# Patient Record
Sex: Female | Born: 1945 | Race: Black or African American | Hispanic: No | Marital: Married | Smoking: Never smoker
Health system: Southern US, Community
[De-identification: ages and names within clinical notes are randomized; demographics above are authoritative.]

## PROBLEM LIST (undated history)

## (undated) DIAGNOSIS — I10 Essential (primary) hypertension: Secondary | ICD-10-CM

## (undated) DIAGNOSIS — E039 Hypothyroidism, unspecified: Secondary | ICD-10-CM

## (undated) DIAGNOSIS — M1990 Unspecified osteoarthritis, unspecified site: Secondary | ICD-10-CM

## (undated) HISTORY — PX: COLONOSCOPY: SHX174

---

## 2017-05-27 DIAGNOSIS — E034 Atrophy of thyroid (acquired): Secondary | ICD-10-CM | POA: Diagnosis not present

## 2017-05-27 DIAGNOSIS — R5381 Other malaise: Secondary | ICD-10-CM | POA: Diagnosis not present

## 2017-05-27 DIAGNOSIS — E785 Hyperlipidemia, unspecified: Secondary | ICD-10-CM | POA: Diagnosis not present

## 2017-05-27 DIAGNOSIS — M545 Low back pain: Secondary | ICD-10-CM | POA: Diagnosis not present

## 2017-05-27 DIAGNOSIS — I10 Essential (primary) hypertension: Secondary | ICD-10-CM | POA: Diagnosis not present

## 2017-05-27 DIAGNOSIS — E7849 Other hyperlipidemia: Secondary | ICD-10-CM | POA: Diagnosis not present

## 2017-05-27 DIAGNOSIS — E669 Obesity, unspecified: Secondary | ICD-10-CM | POA: Diagnosis not present

## 2017-06-06 DIAGNOSIS — E785 Hyperlipidemia, unspecified: Secondary | ICD-10-CM | POA: Diagnosis not present

## 2017-06-06 DIAGNOSIS — I10 Essential (primary) hypertension: Secondary | ICD-10-CM | POA: Diagnosis not present

## 2017-06-06 DIAGNOSIS — E669 Obesity, unspecified: Secondary | ICD-10-CM | POA: Diagnosis not present

## 2017-07-05 DIAGNOSIS — M5440 Lumbago with sciatica, unspecified side: Secondary | ICD-10-CM | POA: Diagnosis not present

## 2017-07-05 DIAGNOSIS — E639 Nutritional deficiency, unspecified: Secondary | ICD-10-CM | POA: Diagnosis not present

## 2017-07-05 DIAGNOSIS — J111 Influenza due to unidentified influenza virus with other respiratory manifestations: Secondary | ICD-10-CM | POA: Diagnosis not present

## 2017-07-05 DIAGNOSIS — E889 Metabolic disorder, unspecified: Secondary | ICD-10-CM | POA: Diagnosis not present

## 2017-08-01 DIAGNOSIS — G8929 Other chronic pain: Secondary | ICD-10-CM | POA: Diagnosis not present

## 2017-08-01 DIAGNOSIS — Z791 Long term (current) use of non-steroidal anti-inflammatories (NSAID): Secondary | ICD-10-CM | POA: Diagnosis not present

## 2017-08-01 DIAGNOSIS — M1990 Unspecified osteoarthritis, unspecified site: Secondary | ICD-10-CM | POA: Diagnosis not present

## 2017-08-01 DIAGNOSIS — G629 Polyneuropathy, unspecified: Secondary | ICD-10-CM | POA: Diagnosis not present

## 2017-08-01 DIAGNOSIS — Z803 Family history of malignant neoplasm of breast: Secondary | ICD-10-CM | POA: Diagnosis not present

## 2017-08-01 DIAGNOSIS — E785 Hyperlipidemia, unspecified: Secondary | ICD-10-CM | POA: Diagnosis not present

## 2017-08-01 DIAGNOSIS — Z8249 Family history of ischemic heart disease and other diseases of the circulatory system: Secondary | ICD-10-CM | POA: Diagnosis not present

## 2017-08-01 DIAGNOSIS — Z833 Family history of diabetes mellitus: Secondary | ICD-10-CM | POA: Diagnosis not present

## 2017-08-01 DIAGNOSIS — I10 Essential (primary) hypertension: Secondary | ICD-10-CM | POA: Diagnosis not present

## 2017-08-01 DIAGNOSIS — Z809 Family history of malignant neoplasm, unspecified: Secondary | ICD-10-CM | POA: Diagnosis not present

## 2017-09-20 DIAGNOSIS — E639 Nutritional deficiency, unspecified: Secondary | ICD-10-CM | POA: Diagnosis not present

## 2017-09-20 DIAGNOSIS — M5440 Lumbago with sciatica, unspecified side: Secondary | ICD-10-CM | POA: Diagnosis not present

## 2017-09-20 DIAGNOSIS — E889 Metabolic disorder, unspecified: Secondary | ICD-10-CM | POA: Diagnosis not present

## 2017-10-20 ENCOUNTER — Other Ambulatory Visit: Payer: Self-pay | Admitting: Internal Medicine

## 2017-10-20 DIAGNOSIS — E889 Metabolic disorder, unspecified: Secondary | ICD-10-CM | POA: Diagnosis not present

## 2017-10-20 DIAGNOSIS — E639 Nutritional deficiency, unspecified: Secondary | ICD-10-CM | POA: Diagnosis not present

## 2017-10-20 DIAGNOSIS — M5440 Lumbago with sciatica, unspecified side: Secondary | ICD-10-CM | POA: Diagnosis not present

## 2017-10-20 DIAGNOSIS — E034 Atrophy of thyroid (acquired): Secondary | ICD-10-CM | POA: Diagnosis not present

## 2017-10-20 DIAGNOSIS — R5381 Other malaise: Secondary | ICD-10-CM | POA: Diagnosis not present

## 2017-10-20 DIAGNOSIS — R634 Abnormal weight loss: Secondary | ICD-10-CM

## 2017-10-21 ENCOUNTER — Other Ambulatory Visit: Payer: Self-pay | Admitting: Internal Medicine

## 2017-10-21 DIAGNOSIS — R634 Abnormal weight loss: Secondary | ICD-10-CM

## 2017-10-28 ENCOUNTER — Ambulatory Visit
Admission: RE | Admit: 2017-10-28 | Discharge: 2017-10-28 | Disposition: A | Discharge status: Home / Self Care | Payer: Medicare HMO | Source: Ambulatory Visit | Attending: Internal Medicine | Admitting: Internal Medicine

## 2017-10-28 DIAGNOSIS — R634 Abnormal weight loss: Secondary | ICD-10-CM

## 2017-11-01 ENCOUNTER — Ambulatory Visit
Admission: RE | Admit: 2017-11-01 | Discharge: 2017-11-01 | Disposition: A | Discharge status: Home / Self Care | Payer: Medicare HMO | Source: Ambulatory Visit | Attending: Internal Medicine | Admitting: Internal Medicine

## 2017-11-01 DIAGNOSIS — R634 Abnormal weight loss: Secondary | ICD-10-CM | POA: Diagnosis not present

## 2017-12-20 DIAGNOSIS — I10 Essential (primary) hypertension: Secondary | ICD-10-CM | POA: Diagnosis not present

## 2017-12-20 DIAGNOSIS — E034 Atrophy of thyroid (acquired): Secondary | ICD-10-CM | POA: Diagnosis not present

## 2017-12-20 DIAGNOSIS — E7849 Other hyperlipidemia: Secondary | ICD-10-CM | POA: Diagnosis not present

## 2017-12-20 DIAGNOSIS — M5440 Lumbago with sciatica, unspecified side: Secondary | ICD-10-CM | POA: Diagnosis not present

## 2017-12-20 DIAGNOSIS — R634 Abnormal weight loss: Secondary | ICD-10-CM | POA: Diagnosis not present

## 2017-12-20 DIAGNOSIS — R5381 Other malaise: Secondary | ICD-10-CM | POA: Diagnosis not present

## 2017-12-20 DIAGNOSIS — E639 Nutritional deficiency, unspecified: Secondary | ICD-10-CM | POA: Diagnosis not present

## 2017-12-20 DIAGNOSIS — E889 Metabolic disorder, unspecified: Secondary | ICD-10-CM | POA: Diagnosis not present

## 2017-12-23 DIAGNOSIS — Z0100 Encounter for examination of eyes and vision without abnormal findings: Secondary | ICD-10-CM | POA: Diagnosis not present

## 2017-12-23 DIAGNOSIS — I10 Essential (primary) hypertension: Secondary | ICD-10-CM | POA: Diagnosis not present

## 2017-12-23 DIAGNOSIS — H524 Presbyopia: Secondary | ICD-10-CM | POA: Diagnosis not present

## 2017-12-23 DIAGNOSIS — E7800 Pure hypercholesterolemia, unspecified: Secondary | ICD-10-CM | POA: Diagnosis not present

## 2017-12-23 DIAGNOSIS — H57059 Tonic pupil, unspecified eye: Secondary | ICD-10-CM | POA: Diagnosis not present

## 2017-12-23 DIAGNOSIS — H2510 Age-related nuclear cataract, unspecified eye: Secondary | ICD-10-CM | POA: Diagnosis not present

## 2018-02-27 DIAGNOSIS — E889 Metabolic disorder, unspecified: Secondary | ICD-10-CM | POA: Diagnosis not present

## 2018-02-27 DIAGNOSIS — M76891 Other specified enthesopathies of right lower limb, excluding foot: Secondary | ICD-10-CM | POA: Diagnosis not present

## 2018-02-27 DIAGNOSIS — E639 Nutritional deficiency, unspecified: Secondary | ICD-10-CM | POA: Diagnosis not present

## 2018-02-27 DIAGNOSIS — M5440 Lumbago with sciatica, unspecified side: Secondary | ICD-10-CM | POA: Diagnosis not present

## 2018-03-27 DIAGNOSIS — M5440 Lumbago with sciatica, unspecified side: Secondary | ICD-10-CM | POA: Diagnosis not present

## 2018-03-27 DIAGNOSIS — I10 Essential (primary) hypertension: Secondary | ICD-10-CM | POA: Diagnosis not present

## 2018-03-27 DIAGNOSIS — E639 Nutritional deficiency, unspecified: Secondary | ICD-10-CM | POA: Diagnosis not present

## 2018-03-27 DIAGNOSIS — E889 Metabolic disorder, unspecified: Secondary | ICD-10-CM | POA: Diagnosis not present

## 2018-06-07 DIAGNOSIS — M5440 Lumbago with sciatica, unspecified side: Secondary | ICD-10-CM | POA: Diagnosis not present

## 2018-06-07 DIAGNOSIS — E639 Nutritional deficiency, unspecified: Secondary | ICD-10-CM | POA: Diagnosis not present

## 2018-06-07 DIAGNOSIS — E889 Metabolic disorder, unspecified: Secondary | ICD-10-CM | POA: Diagnosis not present

## 2018-07-04 DIAGNOSIS — E639 Nutritional deficiency, unspecified: Secondary | ICD-10-CM | POA: Diagnosis not present

## 2018-07-04 DIAGNOSIS — I119 Hypertensive heart disease without heart failure: Secondary | ICD-10-CM | POA: Diagnosis not present

## 2018-07-04 DIAGNOSIS — M7050 Other bursitis of knee, unspecified knee: Secondary | ICD-10-CM | POA: Diagnosis not present

## 2018-07-04 DIAGNOSIS — E889 Metabolic disorder, unspecified: Secondary | ICD-10-CM | POA: Diagnosis not present

## 2018-10-09 DIAGNOSIS — M5440 Lumbago with sciatica, unspecified side: Secondary | ICD-10-CM | POA: Diagnosis not present

## 2018-10-09 DIAGNOSIS — Z0000 Encounter for general adult medical examination without abnormal findings: Secondary | ICD-10-CM | POA: Diagnosis not present

## 2018-10-09 DIAGNOSIS — G629 Polyneuropathy, unspecified: Secondary | ICD-10-CM | POA: Diagnosis not present

## 2018-10-09 DIAGNOSIS — I10 Essential (primary) hypertension: Secondary | ICD-10-CM | POA: Diagnosis not present

## 2018-10-10 DIAGNOSIS — E034 Atrophy of thyroid (acquired): Secondary | ICD-10-CM | POA: Diagnosis not present

## 2018-10-10 DIAGNOSIS — E7849 Other hyperlipidemia: Secondary | ICD-10-CM | POA: Diagnosis not present

## 2018-10-10 DIAGNOSIS — R5381 Other malaise: Secondary | ICD-10-CM | POA: Diagnosis not present

## 2018-10-10 DIAGNOSIS — R634 Abnormal weight loss: Secondary | ICD-10-CM | POA: Diagnosis not present

## 2018-10-10 DIAGNOSIS — I10 Essential (primary) hypertension: Secondary | ICD-10-CM | POA: Diagnosis not present

## 2018-10-17 DIAGNOSIS — R5381 Other malaise: Secondary | ICD-10-CM | POA: Diagnosis not present

## 2018-10-17 DIAGNOSIS — E034 Atrophy of thyroid (acquired): Secondary | ICD-10-CM | POA: Diagnosis not present

## 2018-10-17 DIAGNOSIS — I10 Essential (primary) hypertension: Secondary | ICD-10-CM | POA: Diagnosis not present

## 2018-10-17 DIAGNOSIS — E889 Metabolic disorder, unspecified: Secondary | ICD-10-CM | POA: Diagnosis not present

## 2018-10-17 DIAGNOSIS — M5440 Lumbago with sciatica, unspecified side: Secondary | ICD-10-CM | POA: Diagnosis not present

## 2018-10-17 DIAGNOSIS — R634 Abnormal weight loss: Secondary | ICD-10-CM | POA: Diagnosis not present

## 2018-10-17 DIAGNOSIS — E0590 Thyrotoxicosis, unspecified without thyrotoxic crisis or storm: Secondary | ICD-10-CM | POA: Diagnosis not present

## 2019-03-14 DIAGNOSIS — R69 Illness, unspecified: Secondary | ICD-10-CM | POA: Diagnosis not present

## 2019-03-19 DIAGNOSIS — R69 Illness, unspecified: Secondary | ICD-10-CM | POA: Diagnosis not present

## 2019-03-30 DIAGNOSIS — R5381 Other malaise: Secondary | ICD-10-CM | POA: Diagnosis not present

## 2019-03-30 DIAGNOSIS — E639 Nutritional deficiency, unspecified: Secondary | ICD-10-CM | POA: Diagnosis not present

## 2019-03-30 DIAGNOSIS — E0590 Thyrotoxicosis, unspecified without thyrotoxic crisis or storm: Secondary | ICD-10-CM | POA: Diagnosis not present

## 2019-03-30 DIAGNOSIS — R634 Abnormal weight loss: Secondary | ICD-10-CM | POA: Diagnosis not present

## 2019-03-30 DIAGNOSIS — M5440 Lumbago with sciatica, unspecified side: Secondary | ICD-10-CM | POA: Diagnosis not present

## 2019-03-30 DIAGNOSIS — E889 Metabolic disorder, unspecified: Secondary | ICD-10-CM | POA: Diagnosis not present

## 2019-04-05 ENCOUNTER — Other Ambulatory Visit: Payer: Self-pay | Admitting: Internal Medicine

## 2019-04-05 ENCOUNTER — Other Ambulatory Visit (HOSPITAL_COMMUNITY): Payer: Self-pay | Admitting: Internal Medicine

## 2019-04-05 DIAGNOSIS — R32 Unspecified urinary incontinence: Secondary | ICD-10-CM

## 2019-04-05 DIAGNOSIS — M545 Low back pain, unspecified: Secondary | ICD-10-CM

## 2019-04-05 DIAGNOSIS — N393 Stress incontinence (female) (male): Secondary | ICD-10-CM

## 2019-04-09 ENCOUNTER — Telehealth: Payer: Self-pay

## 2019-04-09 ENCOUNTER — Other Ambulatory Visit: Payer: Self-pay

## 2019-04-09 DIAGNOSIS — Z1211 Encounter for screening for malignant neoplasm of colon: Secondary | ICD-10-CM

## 2019-04-09 DIAGNOSIS — R159 Full incontinence of feces: Secondary | ICD-10-CM | POA: Diagnosis not present

## 2019-04-09 DIAGNOSIS — M5440 Lumbago with sciatica, unspecified side: Secondary | ICD-10-CM | POA: Diagnosis not present

## 2019-04-09 DIAGNOSIS — E889 Metabolic disorder, unspecified: Secondary | ICD-10-CM | POA: Diagnosis not present

## 2019-04-09 DIAGNOSIS — E639 Nutritional deficiency, unspecified: Secondary | ICD-10-CM | POA: Diagnosis not present

## 2019-04-09 MED ORDER — NA SULFATE-K SULFATE-MG SULF 17.5-3.13-1.6 GM/177ML PO SOLN: 354.0000 mL | Freq: Once | ORAL | 0 refills | Status: AC

## 2019-04-09 NOTE — Telephone Encounter (Signed)
Gastroenterology Pre-Procedure Review    PATIENT REVIEW QUESTIONS: The patient responded to the following health history questions as indicated:    1. Are you having any GI issues? no 2. Do you have a personal history of Polyps? no 3. Do you have a family history of Colon Cancer or Polyps? no 4. Diabetes Mellitus? no 5. Joint replacements in the past 12 months?no 6. Major health problems in the past 3 months?no 7. Any artificial heart valves, MVP, or defibrillator?no    MEDICATIONS & ALLERGIES:    Patient reports the following regarding taking any anticoagulation/antiplatelet therapy:   Plavix, Coumadin, Eliquis, Xarelto, Lovenox, Pradaxa, Brilinta, or Effient? no Aspirin? no  Patient confirms/reports the following medications:  No current outpatient medications on file.   No current facility-administered medications for this visit.    Patient confirms/reports the following allergies:  Not on File  No orders of the defined types were placed in this encounter.   AUTHORIZATION INFORMATION Primary Insurance: 1D#: Group #:  Secondary Insurance: 1D#: Group #:  SCHEDULE INFORMATION: Date: 04/17/2019 Time: Location:[facility]

## 2019-04-10 DIAGNOSIS — R69 Illness, unspecified: Secondary | ICD-10-CM | POA: Diagnosis not present

## 2019-04-11 ENCOUNTER — Other Ambulatory Visit: Payer: Medicare HMO

## 2019-04-11 ENCOUNTER — Ambulatory Visit
Admission: RE | Admit: 2019-04-11 | Discharge: 2019-04-11 | Disposition: A | Discharge status: Home / Self Care | Payer: Medicare HMO | Source: Ambulatory Visit | Attending: Internal Medicine | Admitting: Internal Medicine

## 2019-04-11 ENCOUNTER — Other Ambulatory Visit: Payer: Self-pay

## 2019-04-11 DIAGNOSIS — M545 Low back pain, unspecified: Secondary | ICD-10-CM

## 2019-04-11 DIAGNOSIS — R32 Unspecified urinary incontinence: Secondary | ICD-10-CM | POA: Diagnosis present

## 2019-04-11 DIAGNOSIS — N393 Stress incontinence (female) (male): Secondary | ICD-10-CM | POA: Diagnosis not present

## 2019-04-11 DIAGNOSIS — D251 Intramural leiomyoma of uterus: Secondary | ICD-10-CM | POA: Diagnosis not present

## 2019-04-12 ENCOUNTER — Ambulatory Visit: Payer: Medicare HMO

## 2019-04-13 ENCOUNTER — Other Ambulatory Visit: Admission: RE | Admit: 2019-04-13 | Payer: Medicare HMO | Source: Ambulatory Visit

## 2019-04-26 ENCOUNTER — Other Ambulatory Visit
Admission: RE | Admit: 2019-04-26 | Discharge: 2019-04-26 | Disposition: A | Discharge status: Home / Self Care | Payer: Medicare HMO | Source: Ambulatory Visit | Attending: Gastroenterology | Admitting: Gastroenterology

## 2019-04-26 ENCOUNTER — Other Ambulatory Visit: Payer: Self-pay

## 2019-04-26 DIAGNOSIS — Z20828 Contact with and (suspected) exposure to other viral communicable diseases: Secondary | ICD-10-CM | POA: Diagnosis not present

## 2019-04-26 DIAGNOSIS — Z01812 Encounter for preprocedural laboratory examination: Secondary | ICD-10-CM | POA: Insufficient documentation

## 2019-04-27 LAB — SARS CORONAVIRUS 2 (TAT 6-24 HRS): SARS Coronavirus 2: NEGATIVE

## 2019-04-30 ENCOUNTER — Encounter: Payer: Self-pay | Admitting: Gastroenterology

## 2019-05-01 ENCOUNTER — Telehealth: Payer: Self-pay

## 2019-05-01 ENCOUNTER — Ambulatory Visit: Payer: Medicare HMO | Admitting: Anesthesiology

## 2019-05-01 ENCOUNTER — Encounter: Payer: Self-pay | Admitting: Gastroenterology

## 2019-05-01 ENCOUNTER — Other Ambulatory Visit: Payer: Self-pay

## 2019-05-01 ENCOUNTER — Encounter
Admission: RE | Disposition: A | Discharge status: Home / Self Care | Payer: Self-pay | Source: Home / Self Care | Attending: Gastroenterology

## 2019-05-01 ENCOUNTER — Ambulatory Visit
Admission: RE | Admit: 2019-05-01 | Discharge: 2019-05-01 | Disposition: A | Discharge status: Home / Self Care | Payer: Medicare HMO | Attending: Gastroenterology | Admitting: Gastroenterology

## 2019-05-01 DIAGNOSIS — I10 Essential (primary) hypertension: Secondary | ICD-10-CM | POA: Diagnosis not present

## 2019-05-01 DIAGNOSIS — Z79899 Other long term (current) drug therapy: Secondary | ICD-10-CM | POA: Insufficient documentation

## 2019-05-01 DIAGNOSIS — K5730 Diverticulosis of large intestine without perforation or abscess without bleeding: Secondary | ICD-10-CM | POA: Insufficient documentation

## 2019-05-01 DIAGNOSIS — K5790 Diverticulosis of intestine, part unspecified, without perforation or abscess without bleeding: Secondary | ICD-10-CM | POA: Diagnosis not present

## 2019-05-01 DIAGNOSIS — Z1211 Encounter for screening for malignant neoplasm of colon: Secondary | ICD-10-CM

## 2019-05-01 DIAGNOSIS — K635 Polyp of colon: Secondary | ICD-10-CM

## 2019-05-01 DIAGNOSIS — M1990 Unspecified osteoarthritis, unspecified site: Secondary | ICD-10-CM | POA: Insufficient documentation

## 2019-05-01 DIAGNOSIS — E039 Hypothyroidism, unspecified: Secondary | ICD-10-CM | POA: Insufficient documentation

## 2019-05-01 DIAGNOSIS — D125 Benign neoplasm of sigmoid colon: Secondary | ICD-10-CM | POA: Diagnosis not present

## 2019-05-01 DIAGNOSIS — D126 Benign neoplasm of colon, unspecified: Secondary | ICD-10-CM | POA: Diagnosis not present

## 2019-05-01 HISTORY — PX: COLONOSCOPY WITH PROPOFOL: SHX5780

## 2019-05-01 HISTORY — DX: Unspecified osteoarthritis, unspecified site: M19.90

## 2019-05-01 HISTORY — DX: Hypothyroidism, unspecified: E03.9

## 2019-05-01 HISTORY — DX: Essential (primary) hypertension: I10

## 2019-05-01 SURGERY — COLONOSCOPY WITH PROPOFOL
Anesthesia: General

## 2019-05-01 MED ORDER — NA SULFATE-K SULFATE-MG SULF 17.5-3.13-1.6 GM/177ML PO SOLN: 354.0000 mL | Freq: Once | ORAL | 0 refills | Status: AC

## 2019-05-01 MED ORDER — ESMOLOL HCL 100 MG/10ML IV SOLN
INTRAVENOUS | Status: DC | PRN
  Administered 2019-05-01 (×2): 10 mg via INTRAVENOUS

## 2019-05-01 MED ORDER — LIDOCAINE HCL (CARDIAC) PF 100 MG/5ML IV SOSY
PREFILLED_SYRINGE | INTRAVENOUS | Status: DC | PRN
  Administered 2019-05-01: 100 mg via INTRAVENOUS

## 2019-05-01 MED ORDER — PROPOFOL 10 MG/ML IV BOLUS
INTRAVENOUS | Status: DC | PRN
  Administered 2019-05-01: 40 mg via INTRAVENOUS
  Administered 2019-05-01 (×3): 30 mg via INTRAVENOUS
  Administered 2019-05-01: 70 mg via INTRAVENOUS

## 2019-05-01 MED ORDER — SODIUM CHLORIDE 0.9 % IV SOLN: INTRAVENOUS | Status: DC

## 2019-05-01 MED ORDER — BISACODYL EC 5 MG PO TBEC: DELAYED_RELEASE_TABLET | ORAL | 0 refills | Status: AC

## 2019-05-01 MED ORDER — PROPOFOL 500 MG/50ML IV EMUL
INTRAVENOUS | Status: DC | PRN
  Administered 2019-05-01: 100 ug/kg/min via INTRAVENOUS

## 2019-05-01 NOTE — Transfer of Care (Signed)
Immediate Anesthesia Transfer of Care Note  Patient: Joyce Owens  Procedure(s) Performed: COLONOSCOPY WITH PROPOFOL (N/A )  Patient Location: PACU and Endoscopy Unit  Anesthesia Type:General  Level of Consciousness: awake, drowsy and patient cooperative  Airway & Oxygen Therapy: Patient Spontanous Breathing  Post-op Assessment: Report given to RN, Post -op Vital signs reviewed and stable and Patient moving all extremities  Post vital signs: Reviewed and stable  Last Vitals:  Vitals Value Taken Time  BP 158/100 05/01/19 1133  Temp 36.6 C 05/01/19 1133  Pulse 81 05/01/19 1134  Resp 20 05/01/19 1134  SpO2 100 % 05/01/19 1134  Vitals shown include unvalidated device data.  Last Pain:  Vitals:   05/01/19 1133  TempSrc: Temporal  PainSc: 0-No pain         Complications: No apparent anesthesia complications

## 2019-05-01 NOTE — H&P (Signed)
Vonda Antigua, MD [address], [place], [place], [place], [address] [address], [place], [place], [place], [address] Phone: [phone]  Fax: [phone]  Primary Care Physician:  Cletis Athens, MD   Pre-Procedure History & Physical: HPI:  Joyce Owens is a 74 y.o. female is here for a colonoscopy.   Past Medical History:  Diagnosis Date  . Arthritis   . Hypertension   . Hypothyroidism     Past Surgical History:  Procedure Laterality Date  . CESAREAN SECTION    . COLONOSCOPY      Prior to Admission medications   Medication Sig Start Date End Date Taking? Authorizing Provider  atorvastatin (LIPITOR) 10 MG tablet Take 10 mg by mouth daily.   Yes [provider]  enalapril (VASOTEC) 20 MG tablet Take 40 mg by mouth daily.   Yes [provider]    Allergies as of 04/09/2019  . (Not on File)    History reviewed. No pertinent family history.  Social History   Socioeconomic History  . Marital status: Married    Spouse name: Not on file  . Number of children: Not on file  . Years of education: Not on file  . Highest education level: Not on file  Occupational History  . Not on file  Tobacco Use  . Smoking status: Never Smoker  . Smokeless tobacco: Never Used  Substance and Sexual Activity  . Alcohol use: Never  . Drug use: Never  . Sexual activity: Not on file  Other Topics Concern  . Not on file  Social History Narrative  . Not on file   Social Determinants of Health   Financial Resource Strain:   . Difficulty of Paying Living Expenses: Not on file  Food Insecurity:   . Worried About Charity fundraiser in the Last Year: Not on file  . Ran Out of Food in the Last Year: Not on file  Transportation Needs:   . Lack of Transportation (Medical): Not on file  . Lack of Transportation (Non-Medical): Not on file  Physical Activity:   . Days of Exercise per Week: Not on file  . Minutes of Exercise per Session: Not on file    Stress:   . Feeling of Stress : Not on file  Social Connections:   . Frequency of Communication with Friends and Family: Not on file  . Frequency of Social Gatherings with Friends and Family: Not on file  . Attends Religious Services: Not on file  . Active Member of Clubs or Organizations: Not on file  . Attends Archivist Meetings: Not on file  . Marital Status: Not on file  Intimate Partner Violence:   . Fear of Current or Ex-Partner: Not on file  . Emotionally Abused: Not on file  . Physically Abused: Not on file  . Sexually Abused: Not on file    Review of Systems: See HPI, otherwise negative ROS  Physical Exam: BP (!) 163/98   Pulse (!) 103   Temp 97.7 F (36.5 C) (Temporal)   Resp 20   Ht 5\' 2"  (1.575 m)   Wt 63.5 kg   SpO2 100%   BMI 25.61 kg/m  General:   Alert,  pleasant and cooperative in NAD Head:  Normocephalic and atraumatic. Neck:  Supple; no masses or thyromegaly. Lungs:  Clear throughout to auscultation, normal respiratory effort.    Heart:  +S1, +S2, Regular rate and rhythm, No edema. Abdomen:  Soft, nontender and nondistended. Normal bowel sounds,  without guarding, and without rebound.   Neurologic:  Alert and  oriented x4;  grossly normal neurologically.  Impression/Plan: Joyce Owens is here for a colonoscopy to be performed for average risk screening.  Risks, benefits, limitations, and alternatives regarding  colonoscopy have been reviewed with the patient.  Questions have been answered.  All parties agreeable.   Virgel Manifold, MD  05/01/2019, 11:00 AM

## 2019-05-01 NOTE — Anesthesia Postprocedure Evaluation (Signed)
Anesthesia Post Note  Patient: Joyce Owens  Procedure(s) Performed: COLONOSCOPY WITH PROPOFOL (N/A )  Patient location during evaluation: Endoscopy Anesthesia Type: General Level of consciousness: awake and alert Pain management: pain level controlled Vital Signs Assessment: post-procedure vital signs reviewed and stable Respiratory status: spontaneous breathing, nonlabored ventilation, respiratory function stable and patient connected to nasal cannula oxygen Cardiovascular status: blood pressure returned to baseline and stable Postop Assessment: no apparent nausea or vomiting Anesthetic complications: no     Last Vitals:  Vitals:   05/01/19 1143 05/01/19 1153  BP: (!) 163/88 (!) 171/81  Pulse:    Resp:    Temp:    SpO2:      Last Pain:  Vitals:   05/01/19 1153  TempSrc:   PainSc: 0-No pain                 Martha Clan

## 2019-05-01 NOTE — Telephone Encounter (Signed)
Per Dr. Bonna Gains had poor prep. reschedule with any provider for repeat with 2 day prep and low fiber diet for 1 week prior to the procedure

## 2019-05-01 NOTE — Op Note (Signed)
[facility] Gastroenterology Patient Name: Joyce Owens Procedure Date: 05/01/2019 10:56 AM MRN: [id number] Account #: [id number] Date of Birth: [birth year] Admit Type: Outpatient Age: 74 Room: [facility] ENDO ROOM 4 Gender: Female Note Status: Finalized Procedure:             Colonoscopy Indications:           Screening for colorectal malignant neoplasm Providers:             Sabryn Preslar B. Bonna Gains MD, MD Referring MD:          Cletis Athens, MD (Referring MD) Medicines:             Monitored Anesthesia Care Complications:         No immediate complications. Procedure:             Pre-Anesthesia Assessment:                        - ASA Grade Assessment: II - A patient with mild                         systemic disease.                        - Prior to the procedure, a History and Physical was                         performed, and patient medications, allergies and                         sensitivities were reviewed. The patient's tolerance                         of previous anesthesia was reviewed.                        - The risks and benefits of the procedure and the                         sedation options and risks were discussed with the                         patient. All questions were answered and informed                         consent was obtained.                        - Patient identification and proposed procedure were                         verified prior to the procedure by the physician, the                         nurse, the anesthesiologist, the anesthetist and the                         technician. The procedure was verified in the                         procedure room.  After obtaining informed consent, the colonoscope was                         passed under direct vision. Throughout the procedure,                         the patient's blood pressure, pulse, and oxygen                         saturations were  monitored continuously. The                         Colonoscope was introduced through the anus and                         advanced to the the cecum, identified by appendiceal                         orifice and ileocecal valve. The colonoscopy was                         performed with ease. The patient tolerated the                         procedure well. The quality of the bowel preparation                         was poor. Findings:      The perianal and digital rectal examinations were normal.      A 8 mm polyp was found in the sigmoid colon. The polyp was       semi-pedunculated. The polyp was removed with a hot snare. Resection and       retrieval were complete.      A single diverticulum was found in the ascending colon.      The exam was otherwise without abnormality.      The retroflexed view of the distal rectum and anal verge was normal and       showed no anal or rectal abnormalities. Impression:            - Preparation of the colon was poor.                        - One 8 mm polyp in the sigmoid colon, removed with a                         hot snare. Resected and retrieved.                        - Diverticulosis in the ascending colon.                        - The examination was otherwise normal.                        - The distal rectum and anal verge are normal on                         retroflexion view. Recommendation:        -  Discharge patient to home (with escort).                        - Advance diet as tolerated.                        - Continue present medications.                        - Await pathology results.                        - Repeat colonoscopy within 3 months, with 2 day prep.                         Low fiber diet 1 week prior.                        - The findings and recommendations were discussed with                         the patient.                        - The findings and recommendations were discussed with                          the patient's family.                        - Return to primary care physician as previously                         scheduled.                        - High fiber diet. Procedure Code(s):     --- Professional ---                        [phone], Colonoscopy, flexible; with removal of                         tumor(s), polyp(s), or other lesion(s) by snare                         technique Diagnosis Code(s):     --- Professional ---                        Z12.11, Encounter for screening for malignant neoplasm                         of colon                        K63.5, Polyp of colon CPT copyright 2019 American Medical Association. All rights reserved. The codes documented in this report are preliminary and upon coder review may  be revised to meet current compliance requirements.  Vonda Antigua, MD Margretta Sidle B. Bonna Gains MD, MD 05/01/2019 11:34:30 AM This report has been signed electronically. Number of Addenda: 0 Note Initiated On: 05/01/2019 10:56 AM Scope Withdrawal Time: 0 hours 9  minutes 25 seconds  Total Procedure Duration: 0 hours 16 minutes 44 seconds  Estimated Blood Loss:  Estimated blood loss: none.      [facility]

## 2019-05-01 NOTE — Anesthesia Preprocedure Evaluation (Addendum)
Anesthesia Evaluation  Patient identified by MRN, date of birth, ID band Patient awake    Reviewed: Allergy & Precautions, NPO status , Patient's Chart, lab work & pertinent test results  History of Anesthesia Complications Negative for: history of anesthetic complications  Airway Mallampati: II       Dental  (+) Missing, Chipped   Pulmonary neg sleep apnea, neg COPD, Not current smoker,           Cardiovascular hypertension, Pt. on medications (-) Past MI and (-) CHF (-) dysrhythmias (-) Valvular Problems/Murmurs     Neuro/Psych neg Seizures    GI/Hepatic Neg liver ROS, neg GERD  ,  Endo/Other  neg diabetesHypothyroidism   Renal/GU negative Renal ROS     Musculoskeletal   Abdominal   Peds  Hematology   Anesthesia Other Findings   Reproductive/Obstetrics                             Anesthesia Physical Anesthesia Plan  ASA: II  Anesthesia Plan: General   Post-op Pain Management:    Induction: Intravenous  PONV Risk Score and Plan: 3 and Propofol infusion, TIVA and Treatment may vary due to age or medical condition  Airway Management Planned: Nasal Cannula  Additional Equipment:   Intra-op Plan:   Post-operative Plan:   Informed Consent: I have reviewed the patients History and Physical, chart, labs and discussed the procedure including the risks, benefits and alternatives for the proposed anesthesia with the patient or authorized representative who has indicated his/her understanding and acceptance.       Plan Discussed with:   Anesthesia Plan Comments:         Anesthesia Quick Evaluation

## 2019-05-01 NOTE — Telephone Encounter (Signed)
Called and left a message with granddaughter for call back

## 2019-05-01 NOTE — Addendum Note (Signed)
Addended by: Ulyess Blossom L on: 05/01/2019 01:09 PM   Modules accepted: Orders

## 2019-05-01 NOTE — Telephone Encounter (Signed)
Patient called back and scheduled procedure for 05/29/2019. Informed patient of instructions about 1 week low fiber diet. Patient was instructed for 2 day prep also. Sent prep to the pharmacy and mailed instructions to patient

## 2019-05-02 ENCOUNTER — Telehealth: Payer: Self-pay

## 2019-05-02 ENCOUNTER — Encounter: Payer: Self-pay | Admitting: *Deleted

## 2019-05-02 LAB — SURGICAL PATHOLOGY

## 2019-05-02 NOTE — Telephone Encounter (Signed)
Patient was informed of this information and is scheduled for repeat colonoscopy on 05/29/2019

## 2019-05-02 NOTE — Telephone Encounter (Signed)
-----   Message from Virgel Manifold, MD sent at 05/02/2019 10:39 AM EST ----- Caryl Pina please let the patient know, the polyp removed from her colon was precancerous and was completely removed.  However, due to her poor prep, I recommend a colonoscopy within 3 months with a 2-day prep.  Also she should be on a low fiber diet for 1 week prior to the procedure.  Please schedule with any provider available

## 2019-05-07 ENCOUNTER — Telehealth: Payer: Self-pay

## 2019-05-07 DIAGNOSIS — E785 Hyperlipidemia, unspecified: Secondary | ICD-10-CM | POA: Diagnosis not present

## 2019-05-07 DIAGNOSIS — I10 Essential (primary) hypertension: Secondary | ICD-10-CM | POA: Diagnosis not present

## 2019-05-07 DIAGNOSIS — E0500 Thyrotoxicosis with diffuse goiter without thyrotoxic crisis or storm: Secondary | ICD-10-CM | POA: Diagnosis not present

## 2019-05-07 DIAGNOSIS — R634 Abnormal weight loss: Secondary | ICD-10-CM | POA: Diagnosis not present

## 2019-05-07 DIAGNOSIS — R002 Palpitations: Secondary | ICD-10-CM | POA: Diagnosis not present

## 2019-05-07 DIAGNOSIS — E0590 Thyrotoxicosis, unspecified without thyrotoxic crisis or storm: Secondary | ICD-10-CM | POA: Diagnosis not present

## 2019-05-07 NOTE — Telephone Encounter (Signed)
Called and left a message for call back to rescheduled patient colonoscopy that is scheduled on 05/29/2019

## 2019-05-08 ENCOUNTER — Other Ambulatory Visit: Payer: Self-pay

## 2019-05-08 DIAGNOSIS — Z1211 Encounter for screening for malignant neoplasm of colon: Secondary | ICD-10-CM

## 2019-05-08 NOTE — Telephone Encounter (Signed)
Called patient and put patient on for 05/22/2019 in [place] with Dr. Vicente Males. Patient will go for COVID test on 05/18/2019. Went over instructions with patient and patient verbalized understanding and verbalized the 2 day prep

## 2019-05-08 NOTE — Telephone Encounter (Signed)
Patient states she would like to rescheduled it when we know the [place] scheduled. Put patient on the Wait list for [place]. Patient is supposed to be a 2 day prep.

## 2019-05-10 ENCOUNTER — Other Ambulatory Visit: Payer: Self-pay

## 2019-05-10 ENCOUNTER — Encounter: Payer: Self-pay | Admitting: Gastroenterology

## 2019-05-11 DIAGNOSIS — I10 Essential (primary) hypertension: Secondary | ICD-10-CM | POA: Diagnosis not present

## 2019-05-11 DIAGNOSIS — E663 Overweight: Secondary | ICD-10-CM | POA: Diagnosis not present

## 2019-05-11 DIAGNOSIS — R69 Illness, unspecified: Secondary | ICD-10-CM | POA: Diagnosis not present

## 2019-05-15 DIAGNOSIS — R69 Illness, unspecified: Secondary | ICD-10-CM | POA: Diagnosis not present

## 2019-05-16 DIAGNOSIS — E0590 Thyrotoxicosis, unspecified without thyrotoxic crisis or storm: Secondary | ICD-10-CM | POA: Diagnosis not present

## 2019-05-16 DIAGNOSIS — R634 Abnormal weight loss: Secondary | ICD-10-CM | POA: Diagnosis not present

## 2019-05-16 DIAGNOSIS — I10 Essential (primary) hypertension: Secondary | ICD-10-CM | POA: Diagnosis not present

## 2019-05-16 DIAGNOSIS — R002 Palpitations: Secondary | ICD-10-CM | POA: Diagnosis not present

## 2019-05-16 DIAGNOSIS — E785 Hyperlipidemia, unspecified: Secondary | ICD-10-CM | POA: Diagnosis not present

## 2019-05-16 DIAGNOSIS — E0500 Thyrotoxicosis with diffuse goiter without thyrotoxic crisis or storm: Secondary | ICD-10-CM | POA: Diagnosis not present

## 2019-05-17 DIAGNOSIS — R69 Illness, unspecified: Secondary | ICD-10-CM | POA: Diagnosis not present

## 2019-05-18 ENCOUNTER — Other Ambulatory Visit: Payer: Self-pay

## 2019-05-18 ENCOUNTER — Telehealth: Payer: Self-pay

## 2019-05-18 ENCOUNTER — Other Ambulatory Visit
Admission: RE | Admit: 2019-05-18 | Discharge: 2019-05-18 | Disposition: A | Discharge status: Home / Self Care | Payer: Medicare HMO | Source: Ambulatory Visit | Attending: Gastroenterology | Admitting: Gastroenterology

## 2019-05-18 DIAGNOSIS — Z01812 Encounter for preprocedural laboratory examination: Secondary | ICD-10-CM | POA: Insufficient documentation

## 2019-05-18 DIAGNOSIS — Z20822 Contact with and (suspected) exposure to covid-19: Secondary | ICD-10-CM | POA: Insufficient documentation

## 2019-05-18 LAB — SARS CORONAVIRUS 2 (TAT 6-24 HRS): SARS Coronavirus 2: NEGATIVE

## 2019-05-18 MED ORDER — GOLYTELY 236 G PO SOLR: 4000.0000 mL | Freq: Once | ORAL | 0 refills | Status: AC

## 2019-05-18 NOTE — Telephone Encounter (Signed)
Patient states the suprep is to expensive. Called in go[place] to patient pharmacy

## 2019-05-21 NOTE — Discharge Instructions (Signed)
General Anesthesia, Adult, Care After This sheet gives you information about how to care for yourself after your procedure. Your health care provider may also give you more specific instructions. If you have problems or questions, contact your health care provider. What can I expect after the procedure? After the procedure, the following side effects are common:  Pain or discomfort at the IV site.  Nausea.  Vomiting.  Sore throat.  Trouble concentrating.  Feeling cold or chills.  Weak or tired.  Sleepiness and fatigue.  Soreness and body aches. These side effects can affect parts of the body that were not involved in surgery. Follow these instructions at home:  For at least 24 hours after the procedure:  Have a responsible adult stay with you. It is important to have someone help care for you until you are awake and alert.  Rest as needed.  Do not: ? Participate in activities in which you could fall or become injured. ? Drive. ? Use heavy machinery. ? Drink alcohol. ? Take sleeping pills or medicines that cause drowsiness. ? Make important decisions or sign legal documents. ? Take care of children on your own. Eating and drinking  Follow any instructions from your health care provider about eating or drinking restrictions.  When you feel hungry, start by eating small amounts of foods that are soft and easy to digest (bland), such as toast. Gradually return to your regular diet.  Drink enough fluid to keep your urine pale yellow.  If you vomit, rehydrate by drinking water, juice, or clear broth. General instructions  If you have sleep apnea, surgery and certain medicines can increase your risk for breathing problems. Follow instructions from your health care provider about wearing your sleep device: ? Anytime you are sleeping, including during daytime naps. ? While taking prescription pain medicines, sleeping medicines, or medicines that make you drowsy.  Return to  your normal activities as told by your health care provider. Ask your health care provider what activities are safe for you.  Take over-the-counter and prescription medicines only as told by your health care provider.  If you smoke, do not smoke without supervision.  Keep all follow-up visits as told by your health care provider. This is important. Contact a health care provider if:  You have nausea or vomiting that does not get better with medicine.  You cannot eat or drink without vomiting.  You have pain that does not get better with medicine.  You are unable to pass urine.  You develop a skin rash.  You have a fever.  You have redness around your IV site that gets worse. Get help right away if:  You have difficulty breathing.  You have chest pain.  You have blood in your urine or stool, or you vomit blood. Summary  After the procedure, it is common to have a sore throat or nausea. It is also common to feel tired.  Have a responsible adult stay with you for the first 24 hours after general anesthesia. It is important to have someone help care for you until you are awake and alert.  When you feel hungry, start by eating small amounts of foods that are soft and easy to digest (bland), such as toast. Gradually return to your regular diet.  Drink enough fluid to keep your urine pale yellow.  Return to your normal activities as told by your health care provider. Ask your health care provider what activities are safe for you. This information is not   intended to replace advice given to you by your health care provider. Make sure you discuss any questions you have with your health care provider. Document Revised: 04/15/2017 Document Reviewed: 11/26/2016 Elsevier Patient Education  2020 [employer] Inc[employer]  

## 2019-05-21 NOTE — Anesthesia Preprocedure Evaluation (Addendum)
Anesthesia Evaluation  Patient identified by MRN, date of birth, ID band Patient awake    Reviewed: Allergy & Precautions, NPO status , Patient's Chart, lab work & pertinent test results  History of Anesthesia Complications Negative for: history of anesthetic complications  Airway Mallampati: II  TM Distance: >3 FB Neck ROM: Limited    Dental  (+) Upper Dentures, Lower Dentures   Pulmonary    breath sounds clear to auscultation       Cardiovascular hypertension, Pt. on medications (-) angina(-) DOE  Rhythm:Regular Rate:Normal     Neuro/Psych    GI/Hepatic neg GERD  ,  Endo/Other  Hypothyroidism   Renal/GU      Musculoskeletal  (+) Arthritis ,   Abdominal   Peds  Hematology   Anesthesia Other Findings   Reproductive/Obstetrics                            Anesthesia Physical Anesthesia Plan  ASA: II  Anesthesia Plan: General   Post-op Pain Management:    Induction: Intravenous  PONV Risk Score and Plan: 3 and Propofol infusion, TIVA and Treatment may vary due to age or medical condition  Airway Management Planned: Natural Airway and Nasal Cannula  Additional Equipment:   Intra-op Plan:   Post-operative Plan:   Informed Consent: I have reviewed the patients History and Physical, chart, labs and discussed the procedure including the risks, benefits and alternatives for the proposed anesthesia with the patient or authorized representative who has indicated his/her understanding and acceptance.       Plan Discussed with: CRNA and Anesthesiologist  Anesthesia Plan Comments:         Anesthesia Quick Evaluation

## 2019-05-22 ENCOUNTER — Other Ambulatory Visit: Payer: Self-pay

## 2019-05-22 ENCOUNTER — Ambulatory Visit
Admission: RE | Admit: 2019-05-22 | Discharge: 2019-05-22 | Disposition: A | Discharge status: Home / Self Care | Payer: Medicare HMO | Attending: Gastroenterology | Admitting: Gastroenterology

## 2019-05-22 ENCOUNTER — Encounter
Admission: RE | Disposition: A | Discharge status: Home / Self Care | Payer: Self-pay | Source: Home / Self Care | Attending: Gastroenterology

## 2019-05-22 ENCOUNTER — Ambulatory Visit: Payer: Medicare HMO | Admitting: Anesthesiology

## 2019-05-22 DIAGNOSIS — Z1211 Encounter for screening for malignant neoplasm of colon: Secondary | ICD-10-CM | POA: Diagnosis not present

## 2019-05-22 DIAGNOSIS — I10 Essential (primary) hypertension: Secondary | ICD-10-CM | POA: Insufficient documentation

## 2019-05-22 DIAGNOSIS — Z79899 Other long term (current) drug therapy: Secondary | ICD-10-CM | POA: Diagnosis not present

## 2019-05-22 DIAGNOSIS — M1990 Unspecified osteoarthritis, unspecified site: Secondary | ICD-10-CM | POA: Diagnosis not present

## 2019-05-22 HISTORY — PX: COLONOSCOPY WITH PROPOFOL: SHX5780

## 2019-05-22 SURGERY — COLONOSCOPY WITH PROPOFOL
Anesthesia: General

## 2019-05-22 MED ORDER — LACTATED RINGERS IV SOLN
100.0000 mL/h | INTRAVENOUS | Status: DC
  Administered 2019-05-22: 100 mL/h via INTRAVENOUS

## 2019-05-22 MED ORDER — ONDANSETRON HCL 4 MG/2ML IJ SOLN: 4.0000 mg | Freq: Once | INTRAMUSCULAR | Status: DC | PRN

## 2019-05-22 MED ORDER — ACETAMINOPHEN 10 MG/ML IV SOLN: 1000.0000 mg | Freq: Once | INTRAVENOUS | Status: DC | PRN

## 2019-05-22 MED ORDER — LIDOCAINE HCL (CARDIAC) PF 100 MG/5ML IV SOSY
PREFILLED_SYRINGE | INTRAVENOUS | Status: DC | PRN
  Administered 2019-05-22: 40 mg via INTRAVENOUS

## 2019-05-22 MED ORDER — PROPOFOL 10 MG/ML IV BOLUS
INTRAVENOUS | Status: DC | PRN
  Administered 2019-05-22: 30 mg via INTRAVENOUS
  Administered 2019-05-22: 80 mg via INTRAVENOUS
  Administered 2019-05-22 (×2): 20 mg via INTRAVENOUS

## 2019-05-22 SURGICAL SUPPLY — 5 items
CANISTER SUCT 1200ML W/VALVE (MISCELLANEOUS) ×2 IMPLANT
GOWN CVR UNV OPN BCK APRN NK (MISCELLANEOUS) ×2 IMPLANT
GOWN ISOL THUMB LOOP REG UNIV (MISCELLANEOUS) ×2
KIT ENDO PROCEDURE OLY (KITS) ×2 IMPLANT
WATER STERILE IRR 250ML POUR (IV SOLUTION) ×2 IMPLANT

## 2019-05-22 NOTE — Anesthesia Postprocedure Evaluation (Signed)
Anesthesia Post Note  Patient: Joyce Owens  Procedure(s) Performed: COLONOSCOPY WITH PROPOFOL (N/A )     Patient location during evaluation: PACU Anesthesia Type: General Level of consciousness: awake and alert Pain management: pain level controlled Vital Signs Assessment: post-procedure vital signs reviewed and stable Respiratory status: spontaneous breathing, nonlabored ventilation, respiratory function stable and patient connected to nasal cannula oxygen Cardiovascular status: blood pressure returned to baseline and stable Postop Assessment: no apparent nausea or vomiting Anesthetic complications: no    Lailani Tool A  Tiyona Desouza

## 2019-05-22 NOTE — Anesthesia Procedure Notes (Signed)
Performed by: Maribel Luis, CRNA Pre-anesthesia Checklist: Patient identified, Emergency Drugs available, Suction available, Timeout performed and Patient being monitored Patient Re-evaluated:Patient Re-evaluated prior to induction Oxygen Delivery Method: Nasal cannula Placement Confirmation: positive ETCO2       

## 2019-05-22 NOTE — Op Note (Signed)
[facility] Gastroenterology Patient Name: Joyce Owens Procedure Date: 05/22/2019 8:01 AM MRN: [id number] Account #: [id number] Date of Birth: Feb 28, 1946 Admit Type: Outpatient Age: 74 Room: [facility] OR ROOM 01 Gender: Female Note Status: Finalized Procedure:             Colonoscopy Indications:           Screening for colorectal malignant neoplasm Providers:             Jonathon Bellows MD, MD Referring MD:          [facility] ***Barton Dubois (Referring MD), Cletis Athens, MD (Referring MD) Medicines:             Monitored Anesthesia Care Complications:         No immediate complications. Procedure:             Pre-Anesthesia Assessment:                        - Prior to the procedure, a History and Physical was                         performed, and patient medications, allergies and                         sensitivities were reviewed. The patient's tolerance                         of previous anesthesia was reviewed.                        - The risks and benefits of the procedure and the                         sedation options and risks were discussed with the                         patient. All questions were answered and informed                         consent was obtained.                        - ASA Grade Assessment: II - A patient with mild                         systemic disease.                        After obtaining informed consent, the colonoscope was                         passed under direct vision. Throughout the procedure,                         the patient's blood pressure, pulse, and oxygen                         saturations were monitored continuously. The  Colonoscope was introduced through the anus and                         advanced to the the cecum, identified by the                         appendiceal orifice. The colonoscopy was performed                         with ease. The  patient tolerated the procedure well.                         The quality of the bowel preparation was poor. Findings:      The perianal and digital rectal examinations were normal.      A large amount of stool was found in the descending colon, in the       transverse colon and in the ascending colon, interfering with       visualization. Impression:            - Preparation of the colon was poor.                        - Stool in the descending colon, in the transverse                         colon and in the ascending colon.                        - No specimens collected. Recommendation:        - Discharge patient to home (with escort).                        - Resume previous diet.                        - Continue present medications.                        - Repeat colonoscopy in 6 weeks because the bowel                         preparation was suboptimal. Procedure Code(s):     --- Professional ---                        [phone], Colonoscopy, flexible; diagnostic, including                         collection of specimen(s) by brushing or washing, when                         performed (separate procedure) Diagnosis Code(s):     --- Professional ---                        Z12.11, Encounter for screening for malignant neoplasm                         of colon CPT copyright 2019 American Medical Association. All rights reserved. The codes documented in this report  are preliminary and upon coder review may  be revised to meet current compliance requirements. Jonathon Bellows, MD Jonathon Bellows MD, MD 05/22/2019 8:24:43 AM This report has been signed electronically. Number of Addenda: 0 Note Initiated On: 05/22/2019 8:01 AM Scope Withdrawal Time: 0 hours 1 minute 56 seconds  Total Procedure Duration: 0 hours 6 minutes 12 seconds  Estimated Blood Loss:  Estimated blood loss: none.      [facility]

## 2019-05-22 NOTE — H&P (Signed)
Joyce Bellows, MD [address] [place], [place], [place], [address] [address], [place], [place], [place], [address] Phone: [phone]  Fax: [phone]  Primary Care Physician:  Cletis Athens, MD   Pre-Procedure History & Physical: HPI:  Joyce Owens is a 74 y.o. female is here for an colonoscopy.   Past Medical History:  Diagnosis Date  . Arthritis   . Hypertension   . Hypothyroidism     Past Surgical History:  Procedure Laterality Date  . CESAREAN SECTION    . COLONOSCOPY    . COLONOSCOPY WITH PROPOFOL N/A 05/01/2019   Procedure: COLONOSCOPY WITH PROPOFOL;  Surgeon: Virgel Manifold, MD;  Location: [facility] ENDOSCOPY;  Service: Endoscopy;  Laterality: N/A;    Prior to Admission medications   Medication Sig Start Date End Date Taking? Authorizing Provider  atorvastatin (LIPITOR) 10 MG tablet Take 10 mg by mouth daily.   Yes [provider]  bisacodyl (BISACODYL) 5 MG EC tablet Take 2 tablets (10mg ) by mouth the day before your procedure between 1pm-3pm. 05/01/19  Yes Joyce Bellows, MD  enalapril (VASOTEC) 20 MG tablet Take 40 mg by mouth daily.   Yes [provider]    Allergies as of 05/09/2019  . (Not on File)    History reviewed. No pertinent family history.  Social History   Socioeconomic History  . Marital status: Married    Spouse name: Not on file  . Number of children: Not on file  . Years of education: Not on file  . Highest education level: Not on file  Occupational History  . Not on file  Tobacco Use  . Smoking status: Never Smoker  . Smokeless tobacco: Never Used  Substance and Sexual Activity  . Alcohol use: Never  . Drug use: Never  . Sexual activity: Not on file  Other Topics Concern  . Not on file  Social History Narrative  . Not on file   Social Determinants of Health   Financial Resource Strain:   . Difficulty of Paying Living Expenses: Not on file  Food Insecurity:   . Worried About Sales executive in the Last Year: Not on file  . Ran Out of Food in the Last Year: Not on file  Transportation Needs:   . Lack of Transportation (Medical): Not on file  . Lack of Transportation (Non-Medical): Not on file  Physical Activity:   . Days of Exercise per Week: Not on file  . Minutes of Exercise per Session: Not on file  Stress:   . Feeling of Stress : Not on file  Social Connections:   . Frequency of Communication with Friends and Family: Not on file  . Frequency of Social Gatherings with Friends and Family: Not on file  . Attends Religious Services: Not on file  . Active Member of Clubs or Organizations: Not on file  . Attends Archivist Meetings: Not on file  . Marital Status: Not on file  Intimate Partner Violence:   . Fear of Current or Ex-Partner: Not on file  . Emotionally Abused: Not on file  . Physically Abused: Not on file  . Sexually Abused: Not on file    Review of Systems: See HPI, otherwise negative ROS  Physical Exam: BP (!) 190/83   Pulse 74   Temp 98.1 F (36.7 C) (Temporal)   Wt 68.5 kg   SpO2 100%   BMI 27.62 kg/m  General:   Alert,  pleasant and cooperative  in NAD Head:  Normocephalic and atraumatic. Neck:  Supple; no masses or thyromegaly. Lungs:  Clear throughout to auscultation, normal respiratory effort.    Heart:  +S1, +S2, Regular rate and rhythm, No edema. Abdomen:  Soft, nontender and nondistended. Normal bowel sounds, without guarding, and without rebound.   Neurologic:  Alert and  oriented x4;  grossly normal neurologically.  Impression/Plan: Joyce Owens is here for an colonoscopy to be performed for Screening colonoscopy average risk  . Recent colonoscopy had a poor prep  Risks, benefits, limitations, and alternatives regarding  colonoscopy have been reviewed with the patient.  Questions have been answered.  All parties agreeable.   Joyce Bellows, MD  05/22/2019, 7:27 AM

## 2019-05-22 NOTE — Transfer of Care (Signed)
Immediate Anesthesia Transfer of Care Note  Patient: Joyce Owens  Procedure(s) Performed: COLONOSCOPY WITH PROPOFOL (N/A )  Patient Location: PACU  Anesthesia Type: General  Level of Consciousness: awake, alert  and patient cooperative  Airway and Oxygen Therapy: Patient Spontanous Breathing and Patient connected to supplemental oxygen  Post-op Assessment: Post-op Vital signs reviewed, Patient's Cardiovascular Status Stable, Respiratory Function Stable, Patent Airway and No signs of Nausea or vomiting  Post-op Vital Signs: Reviewed and stable  Complications: No apparent anesthesia complications

## 2019-05-23 ENCOUNTER — Encounter: Payer: Self-pay | Admitting: *Deleted

## 2019-05-29 ENCOUNTER — Ambulatory Visit: Admit: 2019-05-29 | Payer: Medicare HMO | Admitting: Gastroenterology

## 2019-05-29 DIAGNOSIS — E889 Metabolic disorder, unspecified: Secondary | ICD-10-CM | POA: Diagnosis not present

## 2019-05-29 DIAGNOSIS — M5440 Lumbago with sciatica, unspecified side: Secondary | ICD-10-CM | POA: Diagnosis not present

## 2019-05-29 DIAGNOSIS — E663 Overweight: Secondary | ICD-10-CM | POA: Diagnosis not present

## 2019-05-29 DIAGNOSIS — E639 Nutritional deficiency, unspecified: Secondary | ICD-10-CM | POA: Diagnosis not present

## 2019-05-29 SURGERY — COLONOSCOPY WITH PROPOFOL
Anesthesia: General

## 2019-05-31 DIAGNOSIS — E0500 Thyrotoxicosis with diffuse goiter without thyrotoxic crisis or storm: Secondary | ICD-10-CM | POA: Diagnosis not present

## 2019-05-31 DIAGNOSIS — R002 Palpitations: Secondary | ICD-10-CM | POA: Diagnosis not present

## 2019-05-31 DIAGNOSIS — I10 Essential (primary) hypertension: Secondary | ICD-10-CM | POA: Diagnosis not present

## 2019-05-31 DIAGNOSIS — E0520 Thyrotoxicosis with toxic multinodular goiter without thyrotoxic crisis or storm: Secondary | ICD-10-CM | POA: Diagnosis not present

## 2019-05-31 DIAGNOSIS — E785 Hyperlipidemia, unspecified: Secondary | ICD-10-CM | POA: Diagnosis not present

## 2019-05-31 DIAGNOSIS — E0590 Thyrotoxicosis, unspecified without thyrotoxic crisis or storm: Secondary | ICD-10-CM | POA: Diagnosis not present

## 2019-05-31 DIAGNOSIS — R634 Abnormal weight loss: Secondary | ICD-10-CM | POA: Diagnosis not present

## 2019-06-15 DIAGNOSIS — I10 Essential (primary) hypertension: Secondary | ICD-10-CM | POA: Diagnosis not present

## 2019-06-15 DIAGNOSIS — E0500 Thyrotoxicosis with diffuse goiter without thyrotoxic crisis or storm: Secondary | ICD-10-CM | POA: Diagnosis not present

## 2019-06-15 DIAGNOSIS — R634 Abnormal weight loss: Secondary | ICD-10-CM | POA: Diagnosis not present

## 2019-06-15 DIAGNOSIS — E785 Hyperlipidemia, unspecified: Secondary | ICD-10-CM | POA: Diagnosis not present

## 2019-06-15 DIAGNOSIS — E0590 Thyrotoxicosis, unspecified without thyrotoxic crisis or storm: Secondary | ICD-10-CM | POA: Diagnosis not present

## 2019-06-15 DIAGNOSIS — R002 Palpitations: Secondary | ICD-10-CM | POA: Diagnosis not present

## 2019-06-15 DIAGNOSIS — E0520 Thyrotoxicosis with toxic multinodular goiter without thyrotoxic crisis or storm: Secondary | ICD-10-CM | POA: Diagnosis not present

## 2019-06-25 DIAGNOSIS — E519 Thiamine deficiency, unspecified: Secondary | ICD-10-CM | POA: Diagnosis not present

## 2019-06-25 DIAGNOSIS — R202 Paresthesia of skin: Secondary | ICD-10-CM | POA: Diagnosis not present

## 2019-06-25 DIAGNOSIS — E538 Deficiency of other specified B group vitamins: Secondary | ICD-10-CM | POA: Diagnosis not present

## 2019-06-25 DIAGNOSIS — R69 Illness, unspecified: Secondary | ICD-10-CM | POA: Diagnosis not present

## 2019-06-25 DIAGNOSIS — M545 Low back pain: Secondary | ICD-10-CM | POA: Diagnosis not present

## 2019-06-25 DIAGNOSIS — E531 Pyridoxine deficiency: Secondary | ICD-10-CM | POA: Diagnosis not present

## 2019-06-25 DIAGNOSIS — E559 Vitamin D deficiency, unspecified: Secondary | ICD-10-CM | POA: Diagnosis not present

## 2019-06-25 DIAGNOSIS — R200 Anesthesia of skin: Secondary | ICD-10-CM | POA: Diagnosis not present

## 2019-06-28 DIAGNOSIS — E538 Deficiency of other specified B group vitamins: Secondary | ICD-10-CM | POA: Diagnosis not present

## 2019-07-05 ENCOUNTER — Telehealth: Payer: Self-pay

## 2019-07-05 NOTE — Telephone Encounter (Addendum)
Spoke with pt regarding repeat colonoscopy. Pt states she is not interested in repeating the colonoscopy at this time as she has "a lot going on". I advised pt to contact our office when she's ready to schedule. Pt agrees.

## 2019-07-05 NOTE — Telephone Encounter (Signed)
Called pt to schedule repeat colonoscopy due to poor bowel prep for most recent colonoscopy procedure.  Unable to contact, LVM to return call

## 2019-07-17 DIAGNOSIS — E0520 Thyrotoxicosis with toxic multinodular goiter without thyrotoxic crisis or storm: Secondary | ICD-10-CM | POA: Diagnosis not present

## 2019-07-17 DIAGNOSIS — E0500 Thyrotoxicosis with diffuse goiter without thyrotoxic crisis or storm: Secondary | ICD-10-CM | POA: Diagnosis not present

## 2019-07-31 DIAGNOSIS — I10 Essential (primary) hypertension: Secondary | ICD-10-CM | POA: Diagnosis not present

## 2019-07-31 DIAGNOSIS — E0500 Thyrotoxicosis with diffuse goiter without thyrotoxic crisis or storm: Secondary | ICD-10-CM | POA: Diagnosis not present

## 2019-07-31 DIAGNOSIS — E785 Hyperlipidemia, unspecified: Secondary | ICD-10-CM | POA: Diagnosis not present

## 2019-07-31 DIAGNOSIS — E0590 Thyrotoxicosis, unspecified without thyrotoxic crisis or storm: Secondary | ICD-10-CM | POA: Diagnosis not present

## 2019-07-31 DIAGNOSIS — Z135 Encounter for screening for eye and ear disorders: Secondary | ICD-10-CM | POA: Diagnosis not present

## 2019-07-31 DIAGNOSIS — R002 Palpitations: Secondary | ICD-10-CM | POA: Diagnosis not present

## 2019-07-31 DIAGNOSIS — H524 Presbyopia: Secondary | ICD-10-CM | POA: Diagnosis not present

## 2019-07-31 DIAGNOSIS — Z0100 Encounter for examination of eyes and vision without abnormal findings: Secondary | ICD-10-CM | POA: Diagnosis not present

## 2019-07-31 DIAGNOSIS — E7800 Pure hypercholesterolemia, unspecified: Secondary | ICD-10-CM | POA: Diagnosis not present

## 2019-07-31 DIAGNOSIS — E0520 Thyrotoxicosis with toxic multinodular goiter without thyrotoxic crisis or storm: Secondary | ICD-10-CM | POA: Diagnosis not present

## 2019-07-31 DIAGNOSIS — R634 Abnormal weight loss: Secondary | ICD-10-CM | POA: Diagnosis not present

## 2019-09-05 DIAGNOSIS — R200 Anesthesia of skin: Secondary | ICD-10-CM | POA: Diagnosis not present

## 2019-09-10 DIAGNOSIS — I10 Essential (primary) hypertension: Secondary | ICD-10-CM | POA: Diagnosis not present

## 2019-09-10 DIAGNOSIS — E785 Hyperlipidemia, unspecified: Secondary | ICD-10-CM | POA: Diagnosis not present

## 2019-09-10 DIAGNOSIS — E0520 Thyrotoxicosis with toxic multinodular goiter without thyrotoxic crisis or storm: Secondary | ICD-10-CM | POA: Diagnosis not present

## 2019-09-10 DIAGNOSIS — R002 Palpitations: Secondary | ICD-10-CM | POA: Diagnosis not present

## 2019-09-10 DIAGNOSIS — E0500 Thyrotoxicosis with diffuse goiter without thyrotoxic crisis or storm: Secondary | ICD-10-CM | POA: Diagnosis not present

## 2019-10-02 ENCOUNTER — Other Ambulatory Visit: Payer: Self-pay

## 2019-10-02 ENCOUNTER — Encounter: Payer: Self-pay | Admitting: Internal Medicine

## 2019-10-02 ENCOUNTER — Ambulatory Visit (INDEPENDENT_AMBULATORY_CARE_PROVIDER_SITE_OTHER): Payer: Medicare HMO | Admitting: Internal Medicine

## 2019-10-02 VITALS — BP 124/64 | HR 78 | Wt 172.4 lb

## 2019-10-02 DIAGNOSIS — M5441 Lumbago with sciatica, right side: Secondary | ICD-10-CM | POA: Diagnosis not present

## 2019-10-02 DIAGNOSIS — R5383 Other fatigue: Secondary | ICD-10-CM

## 2019-10-02 DIAGNOSIS — I10 Essential (primary) hypertension: Secondary | ICD-10-CM

## 2019-10-02 NOTE — Addendum Note (Signed)
Addended by: Alois Cliche on: 10/02/2019 02:16 PM   Modules accepted: Orders

## 2019-10-02 NOTE — Assessment & Plan Note (Signed)
Patient has back pain down both legs. slr was positive on the right side.  We will refer the patient to orthopedic for evaluation.

## 2019-10-02 NOTE — Assessment & Plan Note (Signed)
Check your thyroid status.

## 2019-10-02 NOTE — Progress Notes (Signed)
Patient ID: Joyce Owens, female   DOB: 06-09-1945, 74 y.o.   MRN: [id number]    Established Patient Office Visit  Subjective:  Patient ID: Joyce Owens, female    DOB: 08-07-1945  Age: 74 y.o. MRN: [id number]  CC:  Chief Complaint  Patient presents with  . Fatigue    patient complains of a constant feeling of tiredness, states that she has no energy to do anything   . Back Pain    patient has lower back pain that radiates down right leg, makes if very difficult for her to walk and she is using a cane     HPI  Joyce Owens presents for back pain that has been present for the past 3-4 months and radiates down both of her legs. It is worse in the right leg than the left leg and she rates it as a 10 on a pain scale. She has not seen an orthopedic doctor or gotten an x ray. She has been told that she has a slipped disk in her neck in the past. She recently increased her Gabapentin dose but it has not helped. It has become difficult for her to walk so she now uses a cane. The patient also reports fatigue and that she has gained some weight recently. The patient denies depressed mood, constipation, and any other symptoms or complaints at this time. She also has carpal tunnel syndrome.  Past Medical History:  Diagnosis Date  . Arthritis   . Hypertension   . Hypothyroidism     Past Surgical History:  Procedure Laterality Date  . CESAREAN SECTION    . COLONOSCOPY    . COLONOSCOPY WITH PROPOFOL N/A 05/01/2019   Procedure: COLONOSCOPY WITH PROPOFOL;  Surgeon: Virgel Manifold, MD;  Location: [facility] ENDOSCOPY;  Service: Endoscopy;  Laterality: N/A;  . COLONOSCOPY WITH PROPOFOL N/A 05/22/2019   Procedure: COLONOSCOPY WITH PROPOFOL;  Surgeon: Jonathon Bellows, MD;  Location: [facility];  Service: Gastroenterology;  Laterality: N/A;  2 day prep     History reviewed. No pertinent family history.  Social History   Socioeconomic History  . Marital status: Married    Spouse name:  Not on file  . Number of children: Not on file  . Years of education: Not on file  . Highest education level: Not on file  Occupational History  . Not on file  Tobacco Use  . Smoking status: Never Smoker  . Smokeless tobacco: Never Used  Substance and Sexual Activity  . Alcohol use: Never  . Drug use: Never  . Sexual activity: Not on file  Other Topics Concern  . Not on file  Social History Narrative  . Not on file   Social Determinants of Health   Financial Resource Strain:   . Difficulty of Paying Living Expenses:   Food Insecurity:   . Worried About Charity fundraiser in the Last Year:   . Arboriculturist in the Last Year:   Transportation Needs:   . Film/video editor (Medical):   Marland Kitchen Lack of Transportation (Non-Medical):   Physical Activity:   . Days of Exercise per Week:   . Minutes of Exercise per Session:   Stress:   . Feeling of Stress :   Social Connections:   . Frequency of Communication with Friends and Family:   . Frequency of Social Gatherings with Friends and Family:   . Attends Religious Services:   . Active Member of Clubs or Organizations:   .  Attends Archivist Meetings:   Marland Kitchen Marital Status:   Intimate Partner Violence:   . Fear of Current or Ex-Partner:   . Emotionally Abused:   Marland Kitchen Physically Abused:   . Sexually Abused:      Current Outpatient Medications:  .  atorvastatin (LIPITOR) 10 MG tablet, Take 10 mg by mouth daily., Disp: , Rfl:  .  enalapril (VASOTEC) 20 MG tablet, Take 40 mg by mouth daily., Disp: , Rfl:  .  gabapentin (NEURONTIN) 300 MG capsule, Take 300 mg by mouth 3 (three) times daily., Disp: , Rfl:  .  hydrochlorothiazide (MICROZIDE) 12.5 MG capsule, Take 12.5 mg by mouth., Disp: , Rfl:  .  propranolol (INDERAL) 60 MG tablet, Take 60 mg by mouth daily., Disp: , Rfl:  .  bisacodyl (BISACODYL) 5 MG EC tablet, Take 2 tablets (6m) by mouth the day before your procedure between 1pm-3pm. (Patient not taking: Reported on  10/02/2019), Disp: 2 tablet, Rfl: 0   No Known Allergies  ROS Review of Systems  Constitutional: Positive for fatigue and unexpected weight change.  HENT: Negative.   Eyes: Negative.   Respiratory: Negative.   Cardiovascular: Negative.   Gastrointestinal: Negative.  Negative for constipation.  Endocrine: Negative.   Genitourinary: Negative.   Musculoskeletal: Positive for back pain.  Skin: Negative.   Allergic/Immunologic: Negative.   Neurological: Negative.   Hematological: Negative.   Psychiatric/Behavioral: Negative.  Negative for dysphoric mood.      Objective:    Physical Exam  Constitutional: The patient is oriented to person, place, and time. Pt appears well-developed and well-nourished.  Head: Normocephalic and atraumatic.  Eyes: Pupils are equal, round, and reactive to light.  Neck: No JVD present. No tracheal deviation present. No thyromegaly present.  Cardiovascular: No gallop. Pt has a grade 1/6 systolic murmur. Pulmonary/Chest: Normal breath sounds. Lungs clear to auscultation. Abdominal: No abdominal tenderness. No guarding or rebound tenderness.. Musculoskeletal: Normal range of motion.  Lymphatic: No cervical adenopathy.  Neurological: No cranial nerve deficit. Straight leg test positive on the right side. Skin: Skin is warm and hydrated.  Psychiatric: The patient has a normal mood and affect.  BP 124/64   Pulse 78   Wt 172 lb 6.4 oz (78.2 kg)   BMI 31.53 kg/m  Wt Readings from Last 3 Encounters:  10/02/19 172 lb 6.4 oz (78.2 kg)  05/22/19 151 lb (68.5 kg)  05/01/19 140 lb (63.5 kg)     Health Maintenance Due  Topic Date Due  . Hepatitis C Screening  Never done  . COVID-19 Vaccine (1) Never done  . TETANUS/TDAP  Never done  . MAMMOGRAM  Never done  . DEXA SCAN  Never done  . PNA vac Low Risk Adult (1 of 2 - PCV13) Never done    There are no preventive care reminders to display for this patient.  No results found for: TSH No results found  for: WBC, HGB, HCT, MCV, PLT No results found for: NA, K, CHLORIDE, CO2, GLUCOSE, BUN, CREATININE, BILITOT, ALKPHOS, AST, ALT, PROT, ALBUMIN, CALCIUM, ANIONGAP, EGFR, GFR No results found for: CHOL No results found for: HDL No results found for: LDLCALC No results found for: TRIG No results found for: CHOLHDL No results found for: HGBA1C    Assessment & Plan:   Problem List Items Addressed This Visit      Other   Acute right-sided low back pain with right-sided sciatica - Primary    Patient has back pain down both legs. slr  was positive on the right side.  We will refer the patient to orthopedic for evaluation.      Relevant Orders   Ambulatory referral to Orthopedic Surgery   Fatigue    Check your thyroid status.       Other Visit Diagnoses    Essential hypertension       Relevant Medications   hydrochlorothiazide (MICROZIDE) 12.5 MG capsule   propranolol (INDERAL) 60 MG tablet      No orders of the defined types were placed in this encounter.    1. Acute right-sided low back pain with right-sided sciatica Ortho. - Ambulatory referral to Orthopedic Surgery  2. Fatigue, unspecified type Chronic unspecified fatigue we will recheck the thyroid status.  3. Essential hypertension Blood pressure is under control Follow-up: Return in about 2 weeks (around 10/16/2019).   By signing my name below, I, De Burrs, attest that this documentation has been prepared under the direction and in the presence of Cletis Athens, MD. Electronically Signed: De Burrs, Medical Scribe. 10/02/19. 12:40 PM.  I personally performed the services described in this documentation, which was SCRIBED in my presence. The recorded information has been reviewed and considered accurate. It has been edited as necessary during review. Cletis Athens, MD

## 2019-10-04 DIAGNOSIS — I10 Essential (primary) hypertension: Secondary | ICD-10-CM | POA: Diagnosis not present

## 2019-10-04 DIAGNOSIS — E0590 Thyrotoxicosis, unspecified without thyrotoxic crisis or storm: Secondary | ICD-10-CM | POA: Diagnosis not present

## 2019-10-04 DIAGNOSIS — E785 Hyperlipidemia, unspecified: Secondary | ICD-10-CM | POA: Diagnosis not present

## 2019-10-04 DIAGNOSIS — R002 Palpitations: Secondary | ICD-10-CM | POA: Diagnosis not present

## 2019-10-04 DIAGNOSIS — E0520 Thyrotoxicosis with toxic multinodular goiter without thyrotoxic crisis or storm: Secondary | ICD-10-CM | POA: Diagnosis not present

## 2019-10-04 DIAGNOSIS — E0500 Thyrotoxicosis with diffuse goiter without thyrotoxic crisis or storm: Secondary | ICD-10-CM | POA: Diagnosis not present

## 2019-10-04 DIAGNOSIS — E890 Postprocedural hypothyroidism: Secondary | ICD-10-CM | POA: Diagnosis not present

## 2019-10-04 DIAGNOSIS — R634 Abnormal weight loss: Secondary | ICD-10-CM | POA: Diagnosis not present

## 2019-10-16 ENCOUNTER — Ambulatory Visit: Payer: Medicare HMO | Admitting: Internal Medicine

## 2020-03-10 ENCOUNTER — Other Ambulatory Visit: Payer: Self-pay | Admitting: Internal Medicine

## 2020-03-10 DIAGNOSIS — E663 Overweight: Secondary | ICD-10-CM

## 2020-03-31 IMAGING — US US PELVIS COMPLETE WITH TRANSVAGINAL
1 series · 13 of 25 positions shown · non-contrast
Comparison: None

CLINICAL DATA: Stress incontinence

EXAM:
TRANSABDOMINAL AND TRANSVAGINAL ULTRASOUND OF PELVIS
TECHNIQUE: Both transabdominal and transvaginal ultrasound examinations of the
pelvis were performed. Transabdominal technique was performed for
global imaging of the pelvis including uterus, ovaries, adnexal
regions, and pelvic cul-de-sac. It was necessary to proceed with
endovaginal exam following the transabdominal exam to visualize the
uterus, endometrium and ovaries.

[Series 1: us pelvis complete with transvaginal · 13 of 88 slices shown]
[im 1/88]
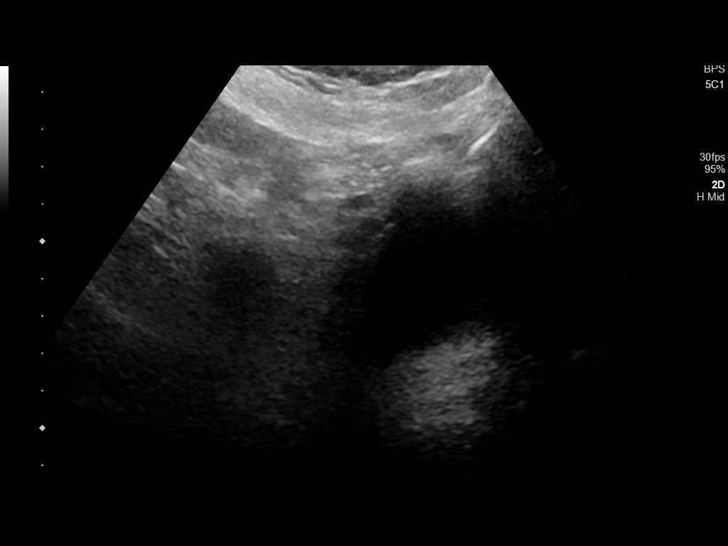
[im 8/88]
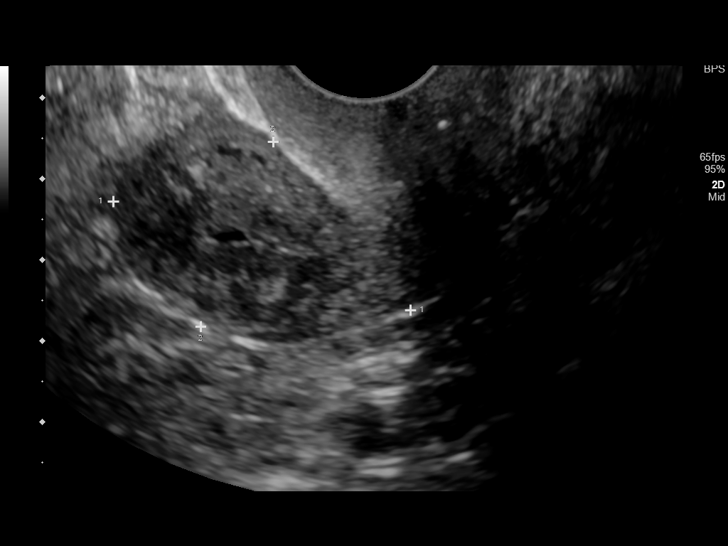
[im 15/88]
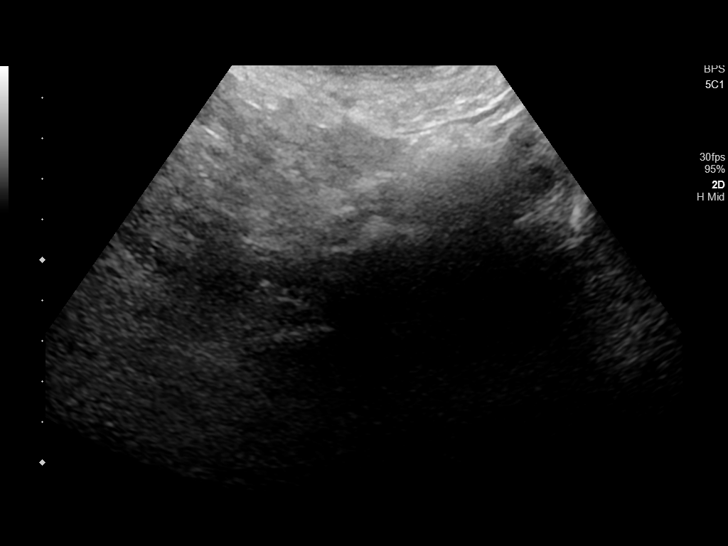
[im 22/88]
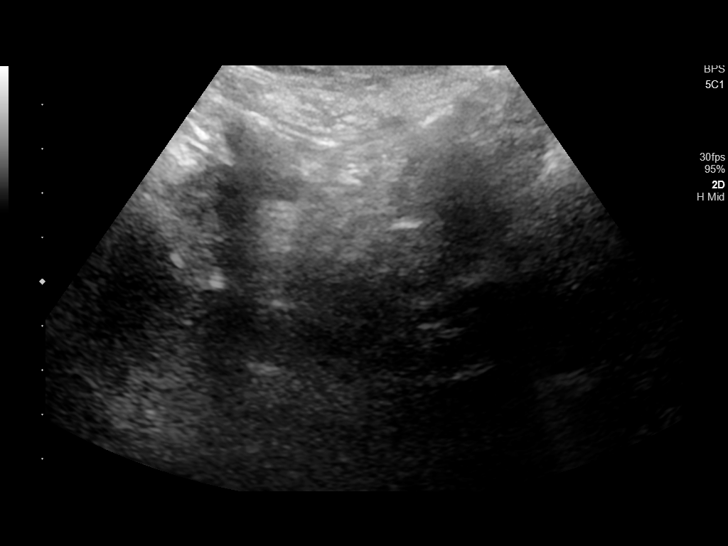
[im 30/88]
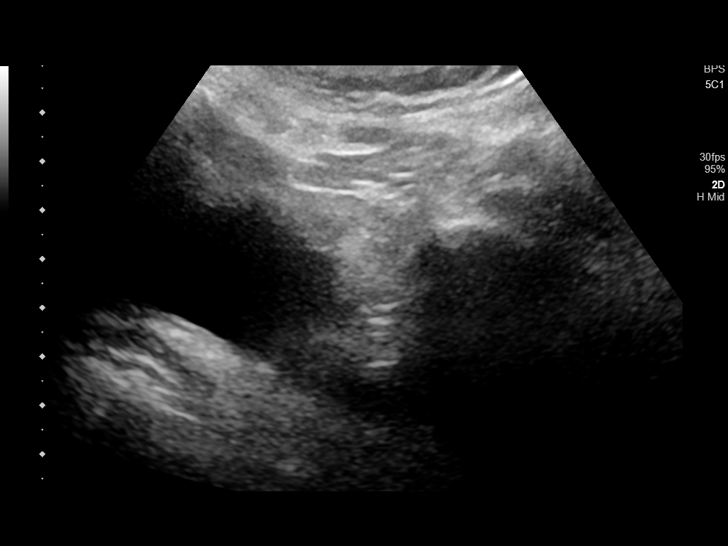
[im 37/88]
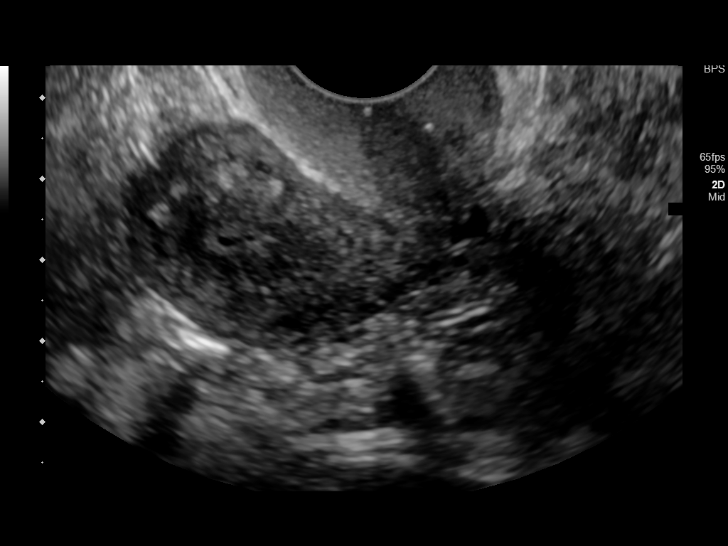
[im 44/88]
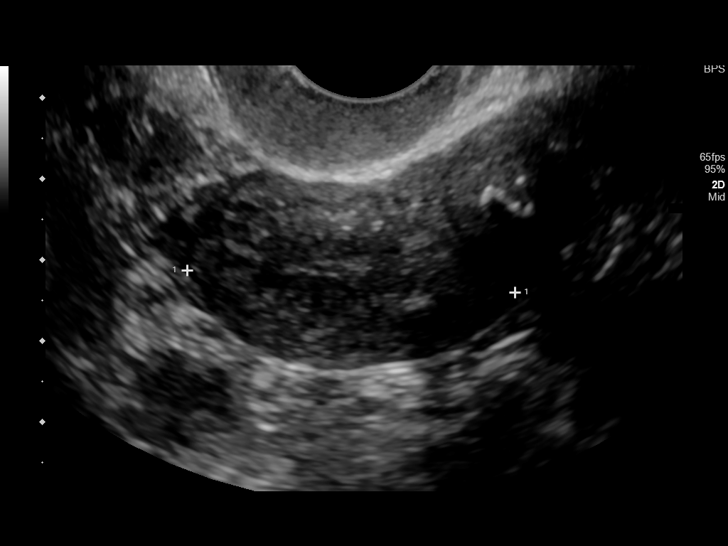
[im 51/88]
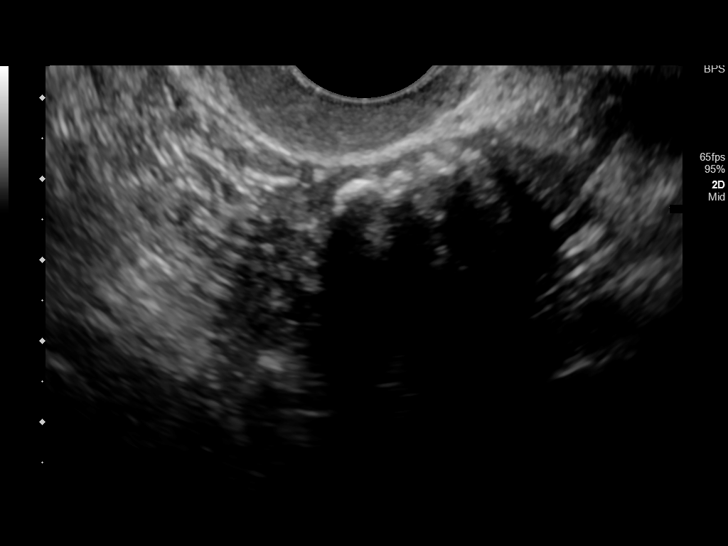
[im 59/88]
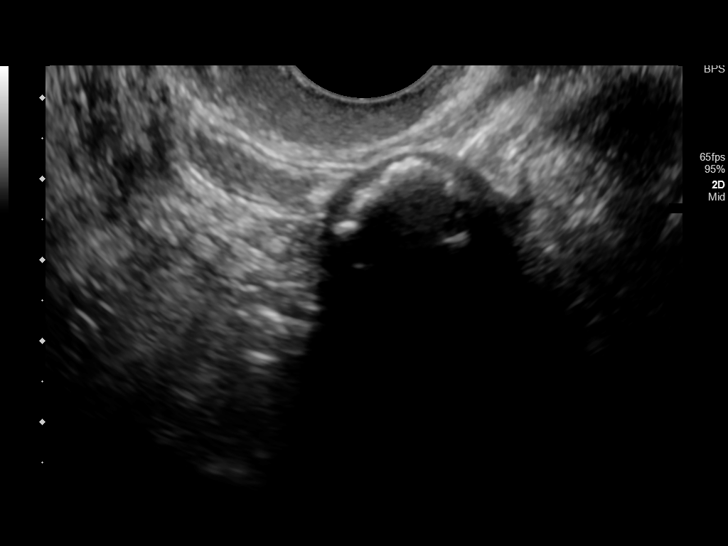
[im 66/88]
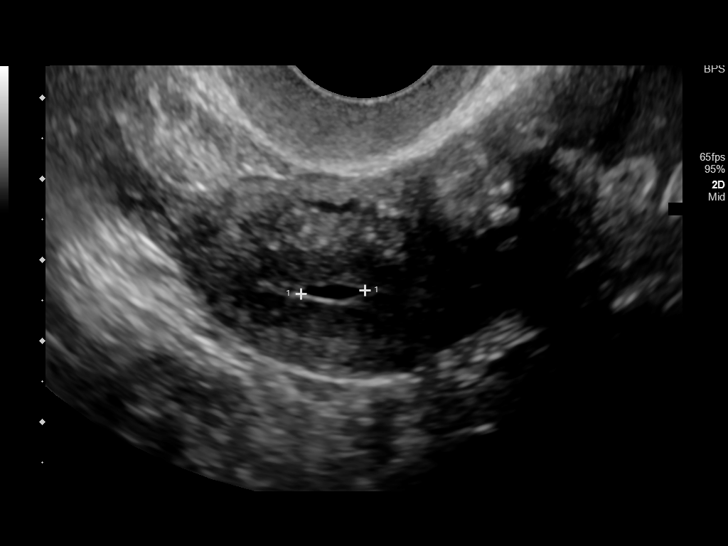
[im 73/88]
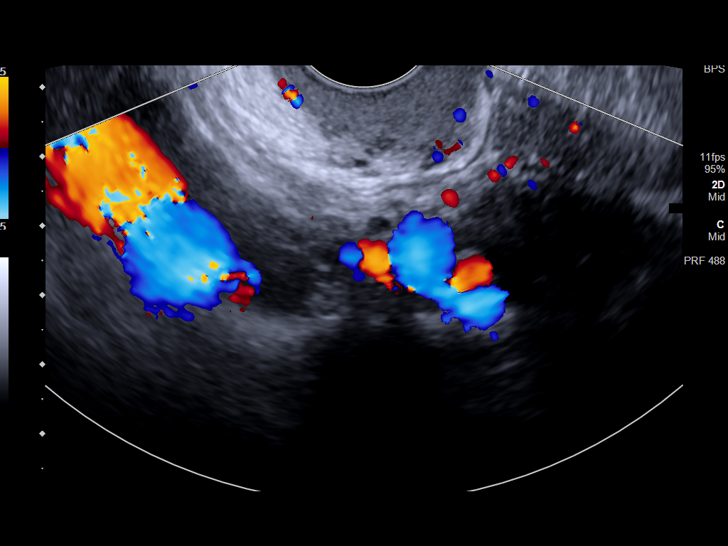
[im 80/88]
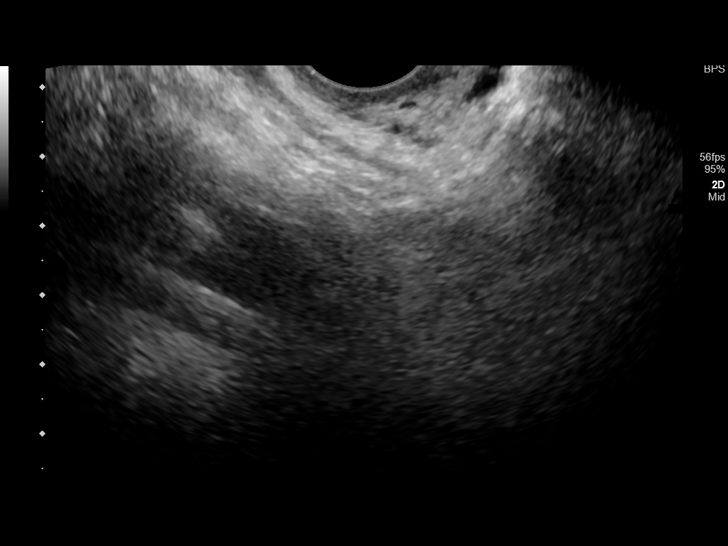
[im 88/88]
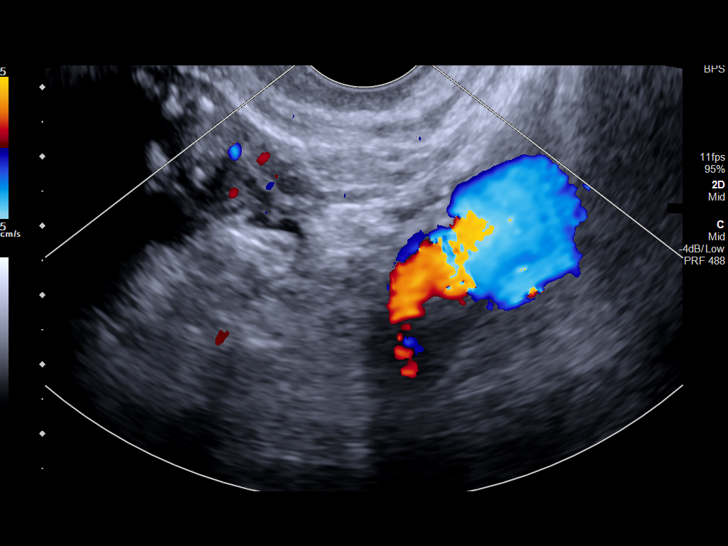

[13 of 25 positions shown; findings below may reference images not displayed]

FINDINGS: Uterus

Measurements: 3.9 x 2.5 x 4.1 cm = volume: 20 mL. Anteverted.
Atrophic. Two calcified leiomyomata are seen within the LEFT uterus
anteriorly, 2.6 x 1.8 x 2.0 cm superiorly and 1.3 x 1.1 x 1.7 cm at
mid uterus, intramural.

Endometrium

Thickness: 1.4 mm. Tiny amount of endometrial fluid. No additional
endometrial abnormalities.

Right ovary

Not visualized on either transabdominal or endovaginal imaging,
likely obscured by bowel

Left ovary

Not visualized on either transabdominal or endovaginal imaging,
likely obscured by bowel

Other findings

No free pelvic fluid.  No adnexal masses.
IMPRESSION: Tiny amount of nonspecific endometrial fluid.

Two small calcified intramural leiomyomata of the LEFT uterus.

Nonvisualization of ovaries.

## 2020-06-27 IMAGING — US US ABDOMEN COMPLETE
1 series · 14 of 25 positions shown · non-contrast
Comparison: None.

CLINICAL DATA: Abnormal weight loss over the last 2 months

EXAM:
ABDOMEN ULTRASOUND COMPLETE

[Series 1: us abdomen complete · 0.11mm/px · 14 of 121 slices shown]
[im 1/121]
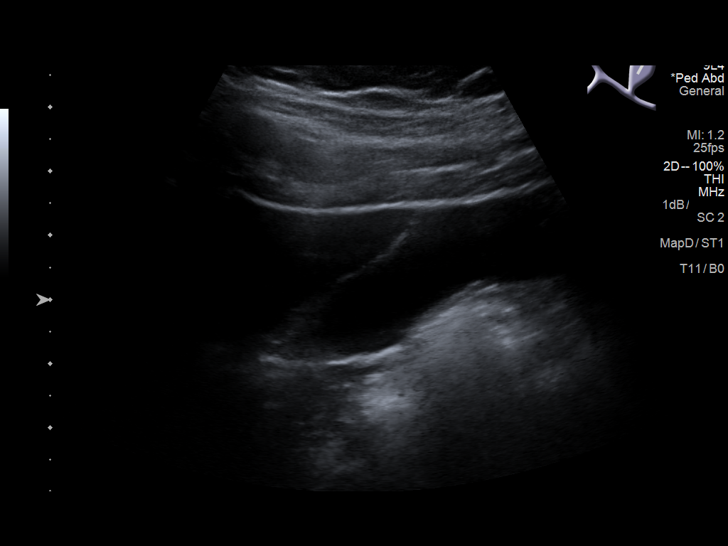
[im 11/121]
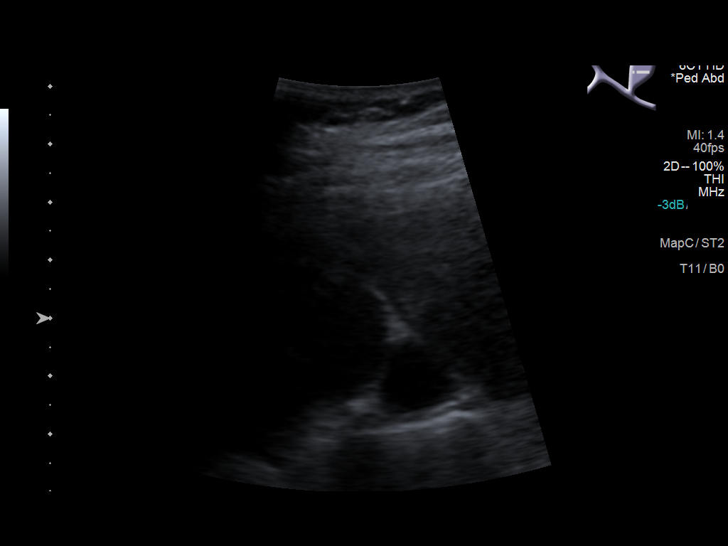
[im 21/121]
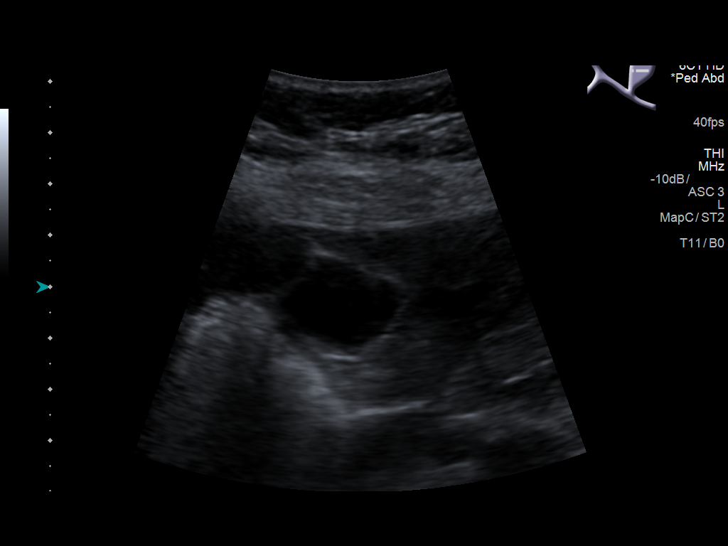
[im 31/121]
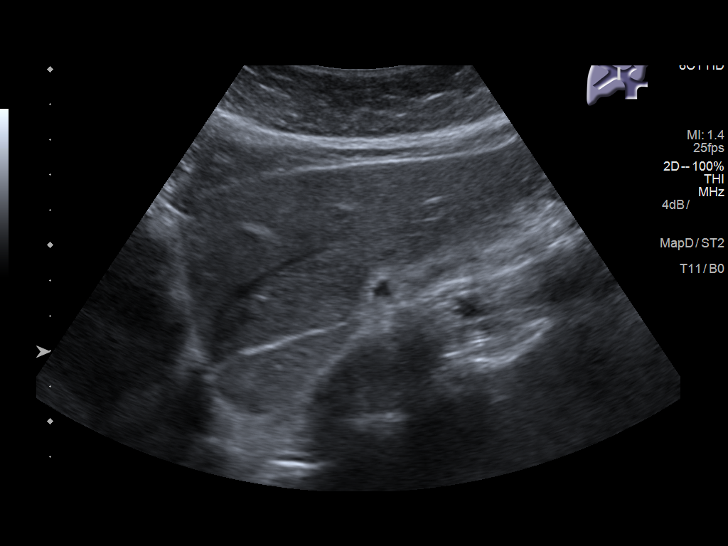
[im 41/121]
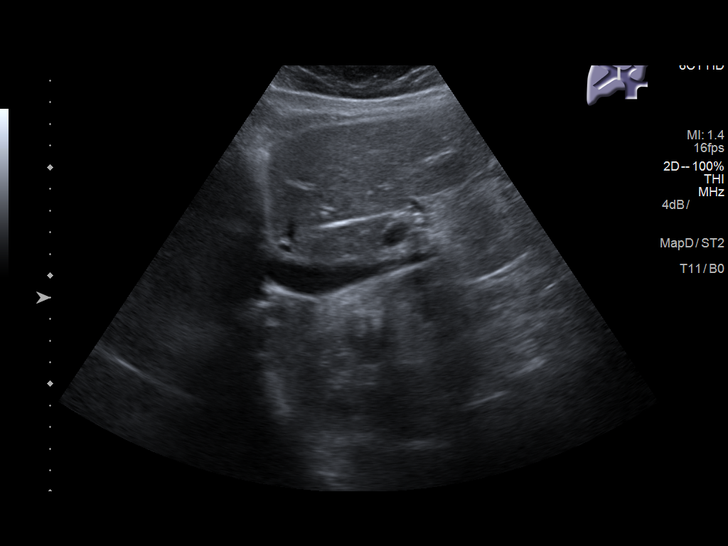
[im 46/121]
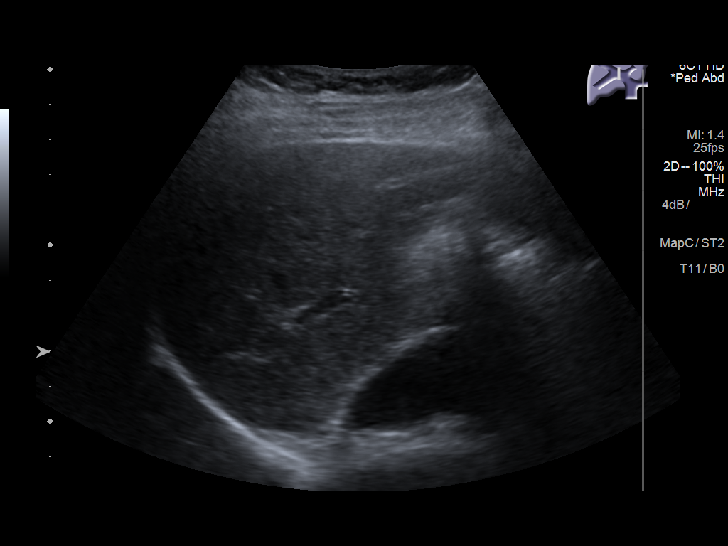
[im 56/121]
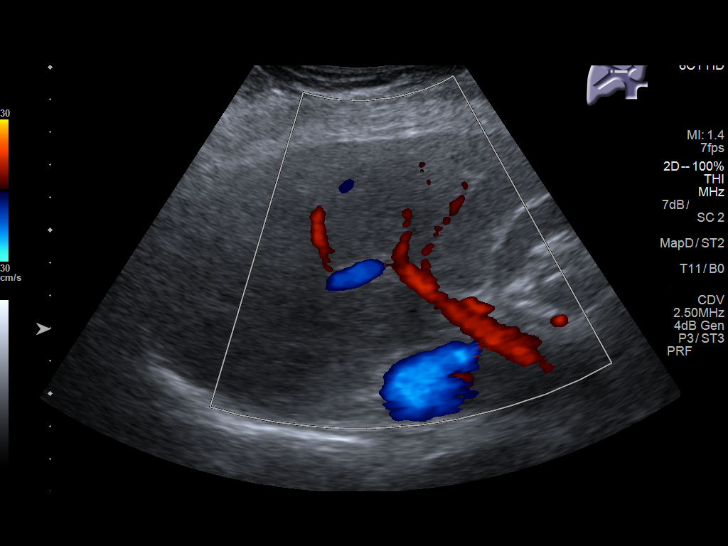
[im 66/121]
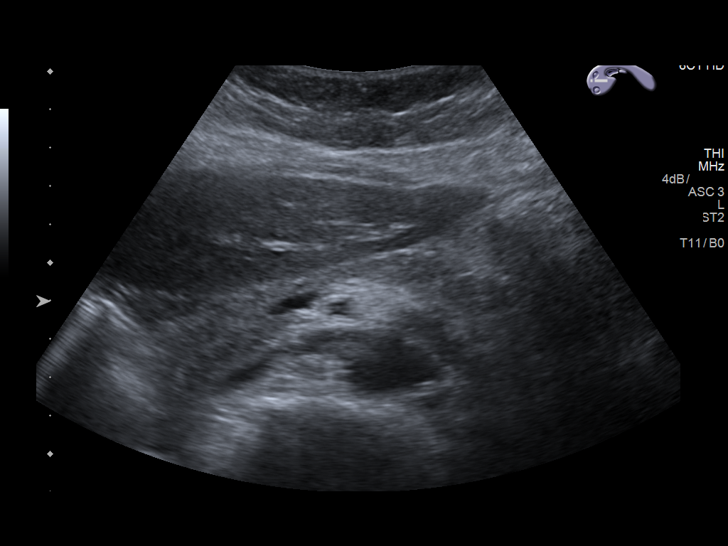
[im 76/121]
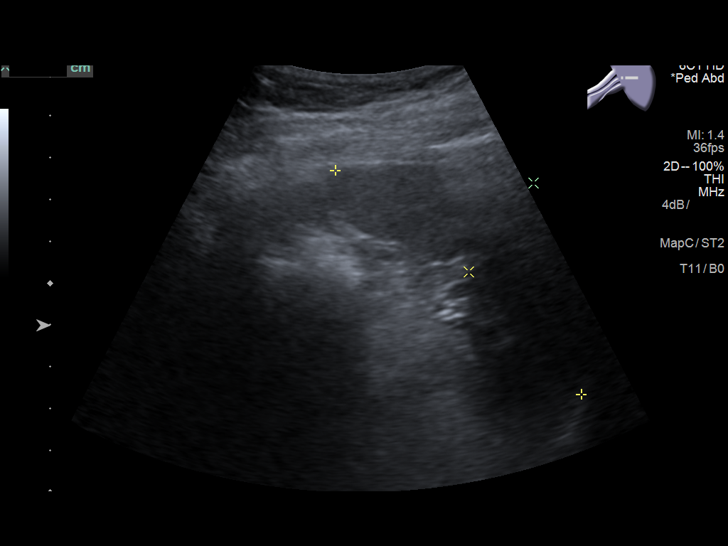
[im 81/121]
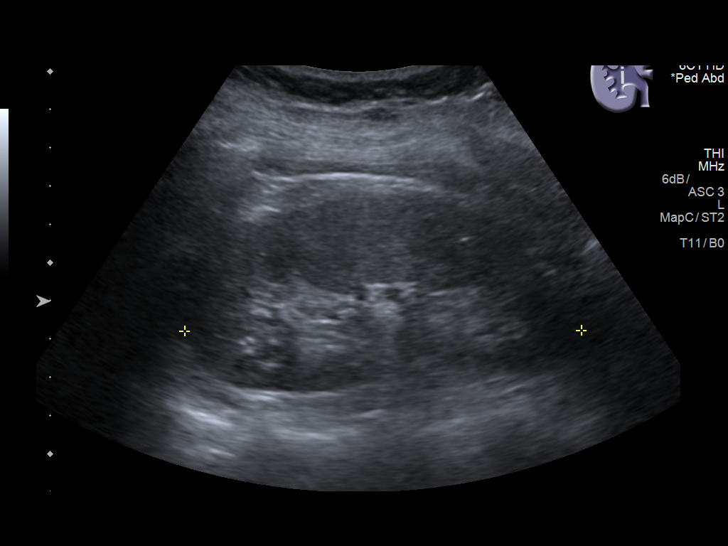
[im 91/121]
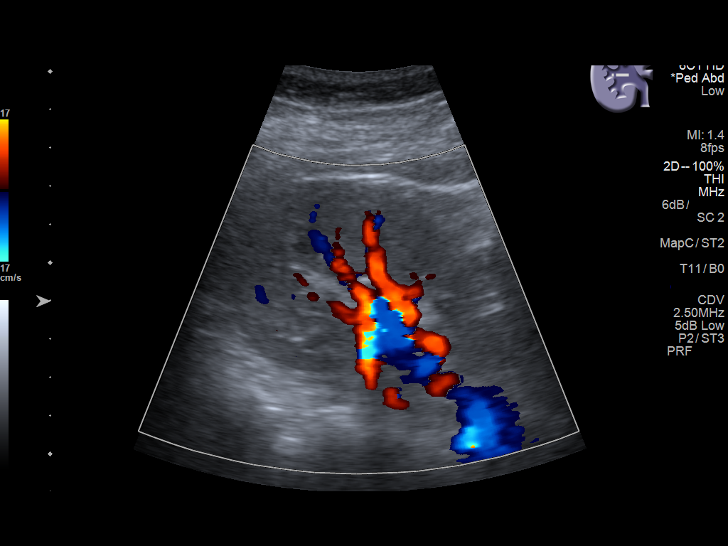
[im 101/121]
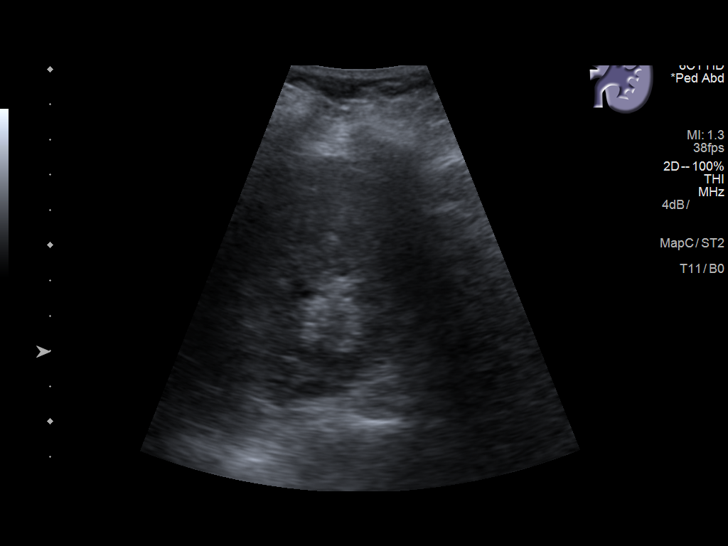
[im 111/121]
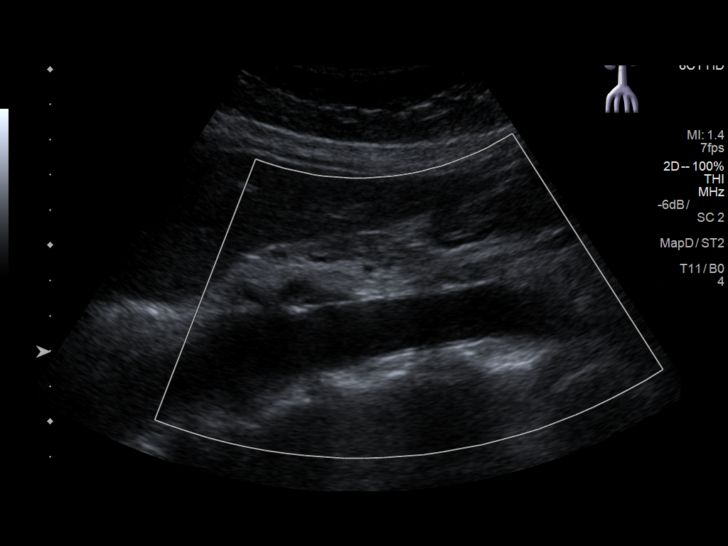
[im 121/121]
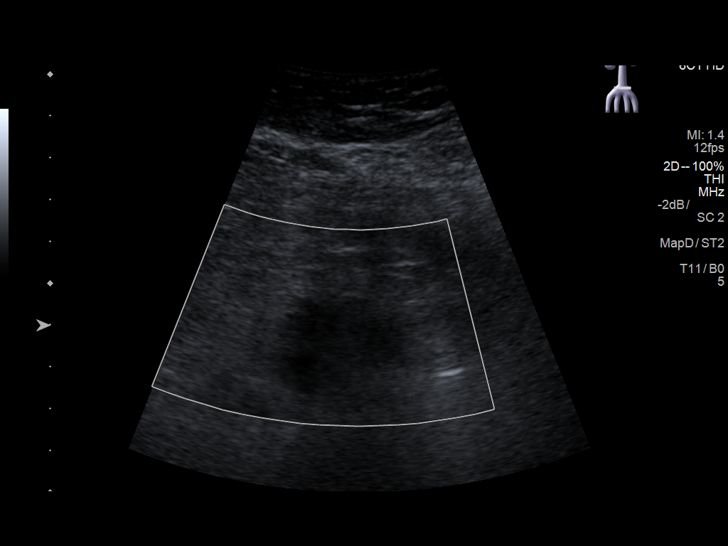

[14 of 25 positions shown; findings below may reference images not displayed]

FINDINGS: Gallbladder: The gallbladder is visualized and no gallstones are
noted. There is no pain over the gallbladder with compression.

Common bile duct: Diameter: The common bile duct is normal measuring
3.1 mm in diameter.

Liver: The liver has a normal echogenic pattern. No focal hepatic
abnormality is seen. Portal vein is patent on color Doppler imaging
with normal direction of blood flow towards the liver.

IVC: No abnormality visualized.

Pancreas: The pancreas is moderately well visualized. Only the
distal tail is obscured by bowel gas.

Spleen: The spleen measures 6.0 cm.

Right Kidney: Length: 10.4 cm.. No hydronephrosis is seen. No opaque
calculi are noted.

Left Kidney: Length: 10.8 cm..  No hydronephrosis is noted.

Abdominal aorta: The abdominal aorta is normal in caliber.

Other findings: None.
IMPRESSION: 1. No gallstones.  No ductal dilatation.
2. No abnormality of the liver is seen.
3. Only the tail of the pancreas is obscured by bowel gas.

## 2023-03-27 DEATH — deceased
